# Patient Record
Sex: Female | Born: 1935 | Race: White | Hispanic: No | Marital: Married | Smoking: Former smoker
Health system: Southern US, Community
[De-identification: ages and names within clinical notes are randomized; demographics above are authoritative.]

## PROBLEM LIST (undated history)

## (undated) DIAGNOSIS — F0280 Dementia in other diseases classified elsewhere without behavioral disturbance: Secondary | ICD-10-CM

## (undated) DIAGNOSIS — H409 Unspecified glaucoma: Secondary | ICD-10-CM

## (undated) HISTORY — PX: ABDOMINAL HYSTERECTOMY: SHX81

---

## 2005-04-17 ENCOUNTER — Ambulatory Visit: Payer: Self-pay

## 2005-05-19 ENCOUNTER — Ambulatory Visit: Payer: Self-pay | Admitting: Obstetrics and Gynecology

## 2005-05-27 ENCOUNTER — Ambulatory Visit: Payer: Self-pay | Admitting: Gastroenterology

## 2006-01-11 ENCOUNTER — Emergency Department: Payer: Self-pay | Admitting: Emergency Medicine

## 2006-06-30 ENCOUNTER — Ambulatory Visit: Payer: Self-pay | Admitting: Internal Medicine

## 2007-07-02 ENCOUNTER — Ambulatory Visit: Payer: Self-pay | Admitting: Internal Medicine

## 2007-11-13 ENCOUNTER — Emergency Department: Payer: Self-pay | Admitting: Emergency Medicine

## 2009-04-25 ENCOUNTER — Ambulatory Visit: Payer: Self-pay | Admitting: Internal Medicine

## 2009-08-28 ENCOUNTER — Ambulatory Visit: Payer: Self-pay | Admitting: Internal Medicine

## 2010-06-12 ENCOUNTER — Ambulatory Visit: Payer: Self-pay | Admitting: Internal Medicine

## 2010-10-02 ENCOUNTER — Ambulatory Visit: Payer: Self-pay | Admitting: Internal Medicine

## 2011-09-30 ENCOUNTER — Ambulatory Visit: Payer: Self-pay | Admitting: Internal Medicine

## 2012-03-31 ENCOUNTER — Emergency Department: Payer: Self-pay | Admitting: Emergency Medicine

## 2012-03-31 LAB — COMPREHENSIVE METABOLIC PANEL
Albumin: 3.9 g/dL (ref 3.4–5.0)
Alkaline Phosphatase: 57 U/L (ref 50–136)
BUN: 21 mg/dL — ABNORMAL HIGH (ref 7–18)
Co2: 30 mmol/L (ref 21–32)
Creatinine: 1.04 mg/dL (ref 0.60–1.30)
EGFR (Non-African Amer.): 50 — ABNORMAL LOW
Osmolality: 284 (ref 275–301)
Potassium: 4 mmol/L (ref 3.5–5.1)
SGOT(AST): 25 U/L (ref 15–37)
SGPT (ALT): 21 U/L
Sodium: 141 mmol/L (ref 136–145)

## 2012-03-31 LAB — CBC
HGB: 14.1 g/dL (ref 12.0–16.0)
MCHC: 32.3 g/dL (ref 32.0–36.0)
RBC: 4.56 10*6/uL (ref 3.80–5.20)
WBC: 5 10*3/uL (ref 3.6–11.0)

## 2012-03-31 LAB — URINALYSIS, COMPLETE
Bacteria: NONE SEEN
Leukocyte Esterase: NEGATIVE
Nitrite: NEGATIVE
Ph: 5 (ref 4.5–8.0)
Protein: NEGATIVE
Specific Gravity: 1.01 (ref 1.003–1.030)
Squamous Epithelial: <1
WBC UR: 1 /HPF (ref 0–5)

## 2013-04-26 ENCOUNTER — Ambulatory Visit: Payer: Self-pay | Admitting: Urology

## 2013-04-26 LAB — BASIC METABOLIC PANEL
Anion Gap: 2 — ABNORMAL LOW (ref 7–16)
BUN: 23 mg/dL — ABNORMAL HIGH (ref 7–18)
Chloride: 106 mmol/L (ref 98–107)
Co2: 34 mmol/L — ABNORMAL HIGH (ref 21–32)
Creatinine: 1.07 mg/dL (ref 0.60–1.30)
EGFR (African American): 55 — ABNORMAL LOW
EGFR (Non-African Amer.): 48 — ABNORMAL LOW
Glucose: 87 mg/dL (ref 65–99)
Osmolality: 286 (ref 275–301)
Sodium: 142 mmol/L (ref 136–145)

## 2013-04-26 LAB — HEMOGLOBIN: HGB: 13.6 g/dL (ref 12.0–16.0)

## 2013-05-10 ENCOUNTER — Ambulatory Visit: Payer: Self-pay | Admitting: Urology

## 2013-05-11 LAB — PATHOLOGY REPORT

## 2014-07-08 ENCOUNTER — Emergency Department: Payer: Self-pay | Admitting: Emergency Medicine

## 2015-04-06 NOTE — Op Note (Signed)
PATIENT NAME:  Angie Wilson, BALUYOT MR#:  [id number] DATE OF BIRTH:  [birth year]  DATE OF PROCEDURE:  05/10/2013  PRINCIPAL DIAGNOSIS:  Right posterior wall bladder tumor.   POSTOPERATIVE DIAGNOSIS:  Same.  PROCEDURE:  Cystoscopy, bladder biopsy.   SURGEON:  Dr. Assunta Gambles.   ANESTHESIA:  Laryngeal mask airway anesthesia.   INDICATIONS: The patient is an 81 year old white female with a long history of chronic cystitis. She was recently noted to have hematuria without evidence of an infection. Cystoscopy demonstrated an approximate 1.5 to 2 cm area of scar and raised, erythematous mucosa with central ulceration on the right posterolateral wall. No other lesions were noted within the bladder. There was no evidence of infection at that time. Upper tracts demonstrated no significant abnormalities. She presents for cystoscopy, bladder biopsy.   PROCEDURE:  After informed consent was obtained, the patient was taken to the operating room and placed in the dorsal lithotomy position under laryngeal mask airway anesthesia. The patient was then prepped and draped in the usual standard fashion. The 22-French rigid cystoscope was introduced into the urethra under direct vision with no urethral abnormalities noted. Upon entering the bladder, the mucosa was inspected in its entirety. The 1.5 cm area of raised, erythematous mucosa was once again noted on the right posterolateral wall. There was a central area of raised scar with ulceration. Cold cup biopsies were obtained of this area. The area was then cauterized utilizing the Bugbee electrode. No significant bleeding was encountered. The bladder was drained. The cystoscope was removed. An 18-French red rubber catheter was inserted without difficulty; 20 mL of 2% lidocaine was instilled into the urinary bladder. The catheter was then removed. The patient was returned to the supine position and awakened from laryngeal mask airway anesthesia. She was taken to the recovery  room in stable condition. There were no problems or complications. The patient tolerated the procedure well.    ____________________________ Madolyn Frieze. Achilles Dunk, MD bsc:dmm D: 05/10/2013 10:48:36 ET T: 05/10/2013 11:14:41 ET JOB#: [id number]  cc: Madolyn Frieze. Achilles Dunk, MD, <Dictator> Madolyn Frieze Mckaylie Vasey MD ELECTRONICALLY SIGNED 05/12/2013 11:31

## 2015-08-01 ENCOUNTER — Emergency Department
Admission: EM | Admit: 2015-08-01 | Discharge: 2015-08-01 | Disposition: A | Discharge status: Home / Self Care | Payer: Medicare Other | Attending: Emergency Medicine | Admitting: Emergency Medicine

## 2015-08-01 ENCOUNTER — Encounter: Payer: Self-pay | Admitting: Urgent Care

## 2015-08-01 DIAGNOSIS — S5001XA Contusion of right elbow, initial encounter: Secondary | ICD-10-CM | POA: Insufficient documentation

## 2015-08-01 DIAGNOSIS — Y998 Other external cause status: Secondary | ICD-10-CM | POA: Insufficient documentation

## 2015-08-01 DIAGNOSIS — W109XXA Fall (on) (from) unspecified stairs and steps, initial encounter: Secondary | ICD-10-CM | POA: Diagnosis not present

## 2015-08-01 DIAGNOSIS — S51011A Laceration without foreign body of right elbow, initial encounter: Secondary | ICD-10-CM | POA: Insufficient documentation

## 2015-08-01 DIAGNOSIS — Z880 Allergy status to penicillin: Secondary | ICD-10-CM | POA: Insufficient documentation

## 2015-08-01 DIAGNOSIS — S8992XA Unspecified injury of left lower leg, initial encounter: Secondary | ICD-10-CM | POA: Diagnosis present

## 2015-08-01 DIAGNOSIS — Y9289 Other specified places as the place of occurrence of the external cause: Secondary | ICD-10-CM | POA: Diagnosis not present

## 2015-08-01 DIAGNOSIS — Y9389 Activity, other specified: Secondary | ICD-10-CM | POA: Insufficient documentation

## 2015-08-01 DIAGNOSIS — S81811A Laceration without foreign body, right lower leg, initial encounter: Secondary | ICD-10-CM | POA: Insufficient documentation

## 2015-08-01 DIAGNOSIS — S81812A Laceration without foreign body, left lower leg, initial encounter: Secondary | ICD-10-CM | POA: Diagnosis not present

## 2015-08-01 DIAGNOSIS — W19XXXA Unspecified fall, initial encounter: Secondary | ICD-10-CM

## 2015-08-01 HISTORY — DX: Unspecified glaucoma: H40.9

## 2015-08-01 MED ORDER — OXYCODONE-ACETAMINOPHEN 5-325 MG PO TABS
1.0000 | ORAL_TABLET | Freq: Once | ORAL | Status: AC
  Administered 2015-08-01: 1 via ORAL
  Filled 2015-08-01: qty 1

## 2015-08-01 MED ORDER — IBUPROFEN 200 MG PO TABS: 600.0000 mg | ORAL_TABLET | Freq: Four times a day (QID) | ORAL | Status: AC | PRN

## 2015-08-01 NOTE — ED Provider Notes (Signed)
[facility] Emergency Department Provider Note  ____________________________________________  Time seen: Approximately  550AM  I have reviewed the triage vital signs and the nursing notes.   HISTORY  Chief Complaint Fall   HPI NOEMY PELLERIN is a 81 y.o. female who reports that she fell going up the steps yesterday. The patient reports that she skinned her leg going up the steps and has a large skin tear to her right shin. The patient reports is very painful. She reports that she washed it off after she injured it but because of the size she decided to come in for evaluation. The patient did not take anything for pain and reports that she did not hit her head. She reports that her last tetanus was one year ago when she fell and did the same thing to her left leg.Patient reports that her pain is a 10 out of 10 in intensity.   Past Medical History  Diagnosis Date  . Glaucoma     There are no active problems to display for this patient.   Past Surgical History  Procedure Laterality Date  . Abdominal hysterectomy      Current Outpatient Rx  Name  Route  Sig  Dispense  Refill  . ibuprofen (MOTRIN IB) 200 MG tablet   Oral   Take 3 tablets (600 mg total) by mouth every 6 (six) hours as needed for moderate pain.   30 tablet   0     Allergies Penicillins  History reviewed. No pertinent family history.  Social History Social History  Substance Use Topics  . Smoking status: Never Smoker   . Smokeless tobacco: None  . Alcohol Use: Yes    Review of Systems Constitutional: No fever/chills Eyes: No visual changes. ENT: No sore throat. Cardiovascular: Denies chest pain. Respiratory: Denies shortness of breath. Gastrointestinal: No abdominal pain.  No nausea, no vomiting.  No diarrhea.  No constipation. Genitourinary: Negative for dysuria. Musculoskeletal: Negative for back pain. Skin: Skin tear Neurological: Negative for headaches, focal  weakness or numbness.  10-point ROS otherwise negative.  ____________________________________________   PHYSICAL EXAM:  VITAL SIGNS: ED Triage Vitals  Enc Vitals Group     BP 08/01/15 0037 164/77 mmHg     Pulse Rate 08/01/15 0037 65     Resp 08/01/15 0037 16     Temp 08/01/15 0037 97.5 F (36.4 C)     Temp Source 08/01/15 0037 Oral     SpO2 08/01/15 0037 94 %     Weight 08/01/15 0037 126 lb (57.153 kg)     Height 08/01/15 0037 5\' 4"  (1.626 m)     Head Cir --      Peak Flow --      Pain Score 08/01/15 0038 6     Pain Loc --      Pain Edu? --      Excl. in GC? --     Constitutional: Alert and oriented. Well appearing and in moderate distress. Eyes: Conjunctivae are normal. PERRL. EOMI. Head: Atraumatic. Nose: No congestion/rhinnorhea. Mouth/Throat: Mucous membranes are moist.  Oropharynx non-erythematous. Cardiovascular: Normal rate, regular rhythm. Grossly normal heart sounds.  Good peripheral circulation. Respiratory: Normal respiratory effort.  No retractions. Lungs CTAB. Gastrointestinal: Soft and nontender. No distention. Positive bowel sounds Musculoskeletal: No lower extremity tenderness nor edema.   Neurologic:  Normal speech and language.  Skin:  Large skin tear to right shin, small skin tear to right elbow Psychiatric: Mood and affect are normal.  ____________________________________________   LABS (all labs ordered are listed, but only abnormal results are displayed)  Labs Reviewed - No data to display ____________________________________________  EKG  None ____________________________________________  RADIOLOGY  None ____________________________________________   PROCEDURES  Procedure(s) performed: None  Critical Care performed: No  ____________________________________________   INITIAL IMPRESSION / ASSESSMENT AND PLAN / ED COURSE  Pertinent labs & imaging results that were available during my care of the patient were reviewed by me  and considered in my medical decision making (see chart for details).  This is an 82 year old female who reports that she fell earlier today and sustained a skin tear to her right shin. The patient reports that given the size she wanted to come and be repaired. The patient's skin tear is significant covering the majority of her right shin but she has folded the flap back over. There is some mild oozing at the site. The area was cleaned and prepped and glued and Steri-Stripped together. There was no significant bleeding after the wound was repaired. The patient will be discharged to follow back up with her primary care physician. She has no other pain she is able to walk she has no swelling or deformity. ____________________________________________   FINAL CLINICAL IMPRESSION(S) / ED DIAGNOSES  Final diagnoses:  Skin tear of left lower leg without complication, initial encounter  Fall, initial encounter  Elbow contusion, right, initial encounter      Rebecka Apley, MD 08/01/15 [phone]

## 2015-08-01 NOTE — Discharge Instructions (Signed)
Contusion A contusion is a deep bruise. Contusions are the result of an injury that caused bleeding under the skin. The contusion may turn blue, purple, or yellow. Minor injuries will give you a painless contusion, but more severe contusions may stay painful and swollen for a few weeks.  CAUSES  A contusion is usually caused by a blow, trauma, or direct force to an area of the body. SYMPTOMS   Swelling and redness of the injured area.  Bruising of the injured area.  Tenderness and soreness of the injured area.  Pain. DIAGNOSIS  The diagnosis can be made by taking a history and physical exam. An X-ray, CT scan, or MRI may be needed to determine if there were any associated injuries, such as fractures. TREATMENT  Specific treatment will depend on what area of the body was injured. In general, the best treatment for a contusion is resting, icing, elevating, and applying cold compresses to the injured area. Over-the-counter medicines may also be recommended for pain control. Ask your caregiver what the best treatment is for your contusion. HOME CARE INSTRUCTIONS   Put ice on the injured area.  Put ice in a plastic bag.  Place a towel between your skin and the bag.  Leave the ice on for 15-20 minutes, 3-4 times a day, or as directed by your health care provider.  Only take over-the-counter or prescription medicines for pain, discomfort, or fever as directed by your caregiver. Your caregiver may recommend avoiding anti-inflammatory medicines (aspirin, ibuprofen, and naproxen) for 48 hours because these medicines may increase bruising.  Rest the injured area.  If possible, elevate the injured area to reduce swelling. SEEK IMMEDIATE MEDICAL CARE IF:   You have increased bruising or swelling.  You have pain that is getting worse.  Your swelling or pain is not relieved with medicines. MAKE SURE YOU:   Understand these instructions.  Will watch your condition.  Will get help right  away if you are not doing well or get worse. Document Released: 09/10/2005 Document Revised: 12/06/2013 Document Reviewed: 10/06/2011 [facility] Patient Information 2015 [place], [place]. This information is not intended to replace advice given to you by your health care provider. Make sure you discuss any questions you have with your health care provider.  Skin Tear Care A skin tear is a wound in which the top layer of skin has peeled off. This is a common problem with aging because the skin becomes thinner and more fragile as a person gets older. In addition, some medicines, such as oral corticosteroids, can lead to skin thinning if taken for long periods of time.  A skin tear is often repaired with tape or skin adhesive strips. This keeps the skin that has been peeled off in contact with the healthier skin beneath. Depending on the location of the wound, a bandage (dressing) may be applied over the tape or skin adhesive strips. Sometimes, during the healing process, the skin turns black and dies. Even when this happens, the torn skin acts as a good dressing until the skin underneath gets healthier and repairs itself. HOME CARE INSTRUCTIONS   Change dressings once per day or as directed by your caregiver.  Gently clean the skin tear and the area around the tear using saline solution or mild soap and water.  Do not rub the injured skin dry. Let the area air dry.  Apply petroleum jelly or an antibiotic cream or ointment to keep the tear moist. This will help the wound heal. Do  not allow a scab to form.  If the dressing sticks before the next dressing change, moisten it with warm soapy water and gently remove it.  Protect the injured skin until it has healed.  Only take over-the-counter or prescription medicines as directed by your caregiver.  Take showers or baths using warm soapy water. Apply a new dressing after the shower or bath.  Keep all follow-up appointments as directed by your  caregiver.  SEEK IMMEDIATE MEDICAL CARE IF:   You have redness, swelling, or increasing pain in the skin tear.  You havepus coming from the skin tear.  You have chills.  You have a red streak that goes away from the skin tear.  You have a bad smell coming from the tear or dressing.  You have a fever or persistent symptoms for more than 2-3 days.  You have a fever and your symptoms suddenly get worse. MAKE SURE YOU:  Understand these instructions.  Will watch this condition.  Will get help right away if your child is not doing well or gets worse. Document Released: 08/26/2001 Document Revised: 08/25/2012 Document Reviewed: 06/14/2012 [facility] Patient Information 2015 [place], [place]. This information is not intended to replace advice given to you by your health care provider. Make sure you discuss any questions you have with your health care provider.   Fall Prevention and Home Safety Falls cause injuries and can affect all age groups. It is possible to prevent falls.  HOW TO PREVENT FALLS  Wear shoes with rubber soles that do not have an opening for your toes.  Keep the inside and outside of your house well lit.  Use night lights throughout your home.  Remove clutter from floors.  Clean up floor spills.  Remove throw rugs or fasten them to the floor with carpet tape.  Do not place electrical cords across pathways.  Put grab bars by your tub, shower, and toilet. Do not use towel bars as grab bars.  Put handrails on both sides of the stairway. Fix loose handrails.  Do not climb on stools or stepladders, if possible.  Do not wax your floors.  Repair uneven or unsafe sidewalks, walkways, or stairs.  Keep items you use a lot within reach.  Be aware of pets.  Keep emergency numbers next to the telephone.  Put smoke detectors in your home and near bedrooms. Ask your doctor what other things you can do to prevent falls. Document Released: 09/27/2009 Document  Revised: 06/01/2012 Document Reviewed: 03/02/2012 [facility] Patient Information 2015 [place], [place]. This information is not intended to replace advice given to you by your health care provider. Make sure you discuss any questions you have with your health care provider.  Wound Care Wound care helps prevent pain and infection.  You may need a tetanus shot if:  You cannot remember when you had your last tetanus shot.  You have never had a tetanus shot.  The injury broke your skin. If you need a tetanus shot and you choose not to have one, you may get tetanus. Sickness from tetanus can be serious. HOME CARE   Only take medicine as told by your doctor.  Clean the wound daily with mild soap and water.  Change any bandages (dressings) as told by your doctor.  Put medicated cream and a bandage on the wound as told by your doctor.  Change the bandage if it gets wet, dirty, or starts to smell.  Take showers. Do not take baths, swim, or do anything  that puts your wound under water.  Rest and raise (elevate) the wound until the pain and puffiness (swelling) are better.  Keep all doctor visits as told. GET HELP RIGHT AWAY IF:   Yellowish-white fluid (pus) comes from the wound.  Medicine does not lessen your pain.  There is a red streak going away from the wound.  You have a fever. MAKE SURE YOU:   Understand these instructions.  Will watch your condition.  Will get help right away if you are not doing well or get worse. Document Released: 09/09/2008 Document Revised: 02/23/2012 Document Reviewed: 04/06/2011 [facility] Patient Information 2015 [place], [place]. This information is not intended to replace advice given to you by your health care provider. Make sure you discuss any questions you have with your health care provider.

## 2015-08-01 NOTE — ED Notes (Signed)
Pt reports falling up stairs, mechanical fall.  Pt reports skin tear to right lower leg, and right elbow.  8" skin tear noted to right lower leg, still bleeding slowly after removing dressing.  Small 1" skin tear noted to right elbow, no bleeding at this time.  Pt NAD

## 2015-08-01 NOTE — ED Notes (Addendum)
Patient presents s/p fall. Patient advising that she fell up some stairs. Large skin tears noted to RIGHT shin. Patient also with small skin tear to RIGHT elbow. Dressings applied in triage.

## 2015-10-09 ENCOUNTER — Other Ambulatory Visit: Payer: Self-pay | Admitting: Internal Medicine

## 2015-10-09 DIAGNOSIS — M48061 Spinal stenosis, lumbar region without neurogenic claudication: Secondary | ICD-10-CM

## 2015-10-10 ENCOUNTER — Other Ambulatory Visit: Payer: Self-pay | Admitting: Internal Medicine

## 2015-10-10 DIAGNOSIS — R9389 Abnormal findings on diagnostic imaging of other specified body structures: Secondary | ICD-10-CM

## 2015-10-12 ENCOUNTER — Ambulatory Visit
Admission: RE | Admit: 2015-10-12 | Discharge: 2015-10-12 | Disposition: A | Discharge status: Home / Self Care | Payer: Medicare Other | Source: Ambulatory Visit | Attending: Internal Medicine | Admitting: Internal Medicine

## 2015-10-12 DIAGNOSIS — I2510 Atherosclerotic heart disease of native coronary artery without angina pectoris: Secondary | ICD-10-CM | POA: Diagnosis not present

## 2015-10-12 DIAGNOSIS — J449 Chronic obstructive pulmonary disease, unspecified: Secondary | ICD-10-CM | POA: Diagnosis not present

## 2015-10-12 DIAGNOSIS — R9389 Abnormal findings on diagnostic imaging of other specified body structures: Secondary | ICD-10-CM

## 2015-10-12 DIAGNOSIS — R918 Other nonspecific abnormal finding of lung field: Secondary | ICD-10-CM | POA: Diagnosis not present

## 2015-10-12 DIAGNOSIS — E042 Nontoxic multinodular goiter: Secondary | ICD-10-CM | POA: Diagnosis not present

## 2015-10-12 DIAGNOSIS — R938 Abnormal findings on diagnostic imaging of other specified body structures: Secondary | ICD-10-CM | POA: Diagnosis present

## 2015-10-12 MED ORDER — IOHEXOL 300 MG/ML  SOLN
75.0000 mL | Freq: Once | INTRAMUSCULAR | Status: AC | PRN
  Administered 2015-10-12: 75 mL via INTRAVENOUS

## 2015-10-18 ENCOUNTER — Other Ambulatory Visit: Payer: Self-pay | Admitting: Neurology

## 2015-10-18 DIAGNOSIS — R413 Other amnesia: Secondary | ICD-10-CM

## 2015-10-18 DIAGNOSIS — R251 Tremor, unspecified: Secondary | ICD-10-CM

## 2015-10-19 ENCOUNTER — Ambulatory Visit
Admission: RE | Admit: 2015-10-19 | Discharge: 2015-10-19 | Disposition: A | Discharge status: Home / Self Care | Payer: Medicare Other | Source: Ambulatory Visit | Attending: Neurology | Admitting: Neurology

## 2015-10-19 ENCOUNTER — Ambulatory Visit
Admission: RE | Admit: 2015-10-19 | Discharge: 2015-10-19 | Disposition: A | Discharge status: Home / Self Care | Payer: Medicare Other | Source: Ambulatory Visit | Attending: Internal Medicine | Admitting: Internal Medicine

## 2015-10-19 DIAGNOSIS — I679 Cerebrovascular disease, unspecified: Secondary | ICD-10-CM | POA: Diagnosis not present

## 2015-10-19 DIAGNOSIS — M47896 Other spondylosis, lumbar region: Secondary | ICD-10-CM | POA: Diagnosis not present

## 2015-10-19 DIAGNOSIS — M5127 Other intervertebral disc displacement, lumbosacral region: Secondary | ICD-10-CM | POA: Insufficient documentation

## 2015-10-19 DIAGNOSIS — M2578 Osteophyte, vertebrae: Secondary | ICD-10-CM | POA: Diagnosis not present

## 2015-10-19 DIAGNOSIS — G319 Degenerative disease of nervous system, unspecified: Secondary | ICD-10-CM | POA: Insufficient documentation

## 2015-10-19 DIAGNOSIS — M4806 Spinal stenosis, lumbar region: Secondary | ICD-10-CM | POA: Insufficient documentation

## 2015-10-19 DIAGNOSIS — M545 Low back pain: Secondary | ICD-10-CM | POA: Diagnosis present

## 2015-10-19 DIAGNOSIS — M48061 Spinal stenosis, lumbar region without neurogenic claudication: Secondary | ICD-10-CM

## 2015-10-19 DIAGNOSIS — R413 Other amnesia: Secondary | ICD-10-CM

## 2015-10-19 DIAGNOSIS — M6281 Muscle weakness (generalized): Secondary | ICD-10-CM | POA: Diagnosis present

## 2015-10-19 DIAGNOSIS — R4189 Other symptoms and signs involving cognitive functions and awareness: Secondary | ICD-10-CM | POA: Diagnosis present

## 2015-10-19 DIAGNOSIS — R251 Tremor, unspecified: Secondary | ICD-10-CM

## 2015-10-22 ENCOUNTER — Other Ambulatory Visit: Payer: Self-pay | Admitting: Neurology

## 2015-10-22 DIAGNOSIS — I671 Cerebral aneurysm, nonruptured: Secondary | ICD-10-CM

## 2015-11-05 ENCOUNTER — Ambulatory Visit
Admission: RE | Admit: 2015-11-05 | Discharge: 2015-11-05 | Disposition: A | Discharge status: Home / Self Care | Payer: Medicare Other | Source: Ambulatory Visit | Attending: Neurology | Admitting: Neurology

## 2015-11-05 DIAGNOSIS — I729 Aneurysm of unspecified site: Secondary | ICD-10-CM | POA: Diagnosis present

## 2015-11-05 DIAGNOSIS — I672 Cerebral atherosclerosis: Secondary | ICD-10-CM | POA: Insufficient documentation

## 2015-11-05 DIAGNOSIS — I671 Cerebral aneurysm, nonruptured: Secondary | ICD-10-CM | POA: Diagnosis not present

## 2015-11-20 ENCOUNTER — Encounter: Payer: Self-pay | Admitting: Physical Therapy

## 2015-11-20 ENCOUNTER — Ambulatory Visit: Payer: Medicare Other | Attending: Neurology | Admitting: Physical Therapy

## 2015-11-20 DIAGNOSIS — R531 Weakness: Secondary | ICD-10-CM | POA: Insufficient documentation

## 2015-11-20 DIAGNOSIS — R262 Difficulty in walking, not elsewhere classified: Secondary | ICD-10-CM | POA: Diagnosis present

## 2015-11-20 DIAGNOSIS — R2681 Unsteadiness on feet: Secondary | ICD-10-CM | POA: Insufficient documentation

## 2015-11-20 NOTE — Therapy (Signed)
[facility] MAIN [facility] SERVICES [address] [place], [place], [address] Phone: [phone]   Fax:  [phone]  Physical Therapy Evaluation  Patient Details  Name: Angie Wilson MRN: [id number] Date of Birth: 1925/12/26 Referring Provider: shah  Encounter Date: 11/20/2015      PT End of Session - 11/20/15 1123    Visit Number 1   Number of Visits 25   Date for PT Re-Evaluation 03/05/2016   Authorization Type g codes   PT Start Time 1100   PT Stop Time 1200   PT Time Calculation (min) 60 min   Equipment Utilized During Treatment Gait belt   Activity Tolerance Patient limited by fatigue   Behavior During Therapy [facility] for tasks assessed/performed      Past Medical History  Diagnosis Date  . Glaucoma     Past Surgical History  Procedure Laterality Date  . Abdominal hysterectomy      There were no vitals filed for this visit.  Visit Diagnosis:  Unsteady gait  Difficulty walking  Weakness      Subjective Assessment - 11/20/15 1109    Subjective Patient had a fall 3 months ago and she has been stumbling in her home and has been using her spc for 6  weeks.    Currently in Pain? Yes   Pain Score 4    Pain Location Back            [facility] PT Assessment - 11/20/15 0001    Assessment   Medical Diagnosis unsteady gait   Referring Provider shah   Onset Date/Surgical Date 09/15/15   Hand Dominance Right   Next MD Visit Feb, 1 2017   Prior Therapy none   Precautions   Precautions Fall   Balance Screen   Has the patient fallen in the past 6 months Yes   How many times? 3   Has the patient had a decrease in activity level because of a fear of falling?  Yes   Is the patient reluctant to leave their home because of a fear of falling?  No   Home [employer] information [employer] manager residence   Living Arrangements Spouse/significant other   Available Help at Discharge Family   Type of Home House   Home Access Stairs to  enter   Entrance Stairs-Number of Steps 3   Entrance Stairs-Rails Right   Home Layout Two level   Alternate Level Stairs-Number of Steps 12   Alternate Level Stairs-Rails Right   Home Equipment Grab bars - tub/shower   Prior Function   Level of Independence Independent   Vocation Retired       PAIN: back pain 5/10  POSTURE: WNL  PROM/AROM:  STRENGTH:  Graded on a 0-5 scale Muscle Group Left Right  Shoulder flex    Shoulder Abd    Shoulder Ext    Shoulder IR/ER    Elbow    Wrist/hand    Hip Flex 3 3  Hip Abd 3 3  Hip Add 2 2  Hip Ext 2 2  Hip IR/ER 3 3  Knee Flex 4 4  Knee Ext 4 4  Ankle DF 4 4  Ankle PF 4 4   SENSATION: WFL         BALANCE: unable to tandem stand, unable to single leg stand    GAIT: deviations in path and unsteady with spc  OUTCOME MEASURES: TEST Outcome Interpretation  5 times sit<>stand 15.26sec >52 yo, >15  sec indicates increased risk for falls  10 meter walk test    1.05             m/s <1.0 m/s indicates increased risk for falls; limited community ambulator  Timed up and Go   14.87              sec <14 sec indicates increased risk for falls  6 minute walk test       650      Feet 1000 feet is community Financial controller  <36/56 (100% risk for falls), 37-45 (80% risk for falls); 46-51 (>50% risk for falls); 52-55 (lower risk <25% of falls)                                  PT Long Term Goals - 11-21-15 1127    PT LONG TERM GOAL #1   Title Patient will increase BLE gross strength to 4+/5 as to improve functional strength for independent gait, increased standing tolerance and increased ADL ability   Time 12   Period Weeks   Status New   PT LONG TERM GOAL #2   Title Patient will increase BLE gross strength to 4+/5 as to improve functional strength for independent gait, increased standing tolerance and increased ADL ability   Time 12   Period Weeks   Status New   PT LONG TERM GOAL #3    Title Patient will tolerate 5 seconds of single leg stance without loss of balance to improve ability to get in and out of shower safely   Time 12   Period Weeks   Status New   PT LONG TERM GOAL #4   Title Patient will reduce timed up and go to <11 seconds to reduce fall risk and demonstrate improved transfer/gait ability   Time 12   Period Weeks   Status New   PT LONG TERM GOAL #5   Title Patient will increase six minute walk test distance to >1000 for progression to community ambulator and improve gait ability   Time 12   Status New               Plan - 2015/11/21 1125    Clinical Impression Statement Pateint is 81 yr old female with unsteady gait and decreased dynamic stanidng balnace. She is ambulating with a spc and has had 3 falls in the last 6 months. She has uneven path during gait with staggering  steps.    Pt will benefit from skilled therapeutic intervention in order to improve on the following deficits Abnormal gait;Decreased balance;Decreased endurance;Decreased mobility;Decreased strength;Pain   Rehab Potential Good   PT Frequency 2x / week   PT Duration 12 weeks   PT Treatment/Interventions Neuromuscular re-education;Balance training;Therapeutic exercise;Therapeutic activities;Gait training;Stair training   PT Next Visit Plan Balance training   PT Home Exercise Plan HEP corner balance exercises   Consulted and Agree with Plan of Care Patient          G-Codes - 11-21-2015 1131    Functional Assessment Tool Used 5 x sit to stand, TUG, 10 MW, 6 MW   Functional Limitation Mobility: Walking and moving around   Mobility: Walking and Moving Around Current Status (U4403) At least 40 percent but less than 60 percent impaired, limited or restricted   Mobility: Walking and Moving Around Goal Status (K7425) At least 20 percent but less than 40 percent impaired, limited  or restricted       Problem List There are no active problems to display for this  patient.   Angie Wilson 11/20/2015, 12:04 PM  [facility] MAIN [facility] SERVICES [address] [place], [place], [address] Phone: [phone]   Fax:  [phone]  Name: Angie Wilson MRN: [id number] Date of Birth: 04-Mar-1926

## 2015-11-27 ENCOUNTER — Ambulatory Visit: Payer: Medicare Other | Admitting: Physical Therapy

## 2015-11-27 ENCOUNTER — Encounter: Payer: Self-pay | Admitting: Physical Therapy

## 2015-11-27 DIAGNOSIS — R262 Difficulty in walking, not elsewhere classified: Secondary | ICD-10-CM

## 2015-11-27 DIAGNOSIS — R2681 Unsteadiness on feet: Secondary | ICD-10-CM | POA: Diagnosis not present

## 2015-11-27 DIAGNOSIS — R531 Weakness: Secondary | ICD-10-CM

## 2015-11-27 NOTE — Patient Instructions (Signed)
Balance, Proprioception: Hip Abduction With Tubing   With tubing attached to both ankles, Standing holding onto counter, kick one leg out to side and then Return.  Repeat _10___ times  On each side.  Do ___2_ sessions per day.  http://cc.exer.us/20   Copyright  VHI. All rights reserved.  Balance, Proprioception: Hip Adduction With Tubing   With tubing tied around table leg, loop band around one ankle and try to cross leg in front. Return. Repeat _10___ times. do __2__ sessions per day.  http://cc.exer.[email or web address]   Copyright  VHI. All rights reserved.  Balance, Proprioception: Hip Extension With Tubing   With tubing tied around both legs, holding onto kitchen counter, swing leg back. Return. Repeat _10___ times . Do __2__ sessions per day.  http://cc.exer.us/19   Copyright  VHI. All rights reserved.  Balance, Proprioception: Hip Flexion With Tubing   With tubing attached to both ankles, swing leg forward. Return. Repeat _10___ times. Do __2__ sessions per day. SIT TO STAND: No Device    Sit with feet shoulder-width apart, on floor. Lean chest forward, raise hips up from surface. Straighten hips and knees. Weight bear equally on left and right sides. ___ reps per set, ___ sets per day, ___ days per week Place left leg closer to sitting surface.   Single Leg - Eyes Open  Holding support, lift right leg while maintaining balance over other leg. Progress to removing hands from support surface for longer periods of time. Hold____ seconds. Repeat ____ times per session. Do ____ sessions per day. Single Leg (Compliant Surface) - Eyes Open  Stand on compliant surface: ________ holding support. Lift right leg while maintaining balance over other leg. Progress to removing hands from support surface for longer periods of time. Hold____ seconds. Repeat ____ times per session. Do ____ sessions per day.  Feet Heel-Toe "Tandem", Arm Motion  Eyes Open  With eyes open, right foot  directly in front of the other, move arms up and down: to front. Repeat ____ times per session. Do ____ sessions per day. Feet Heel-Toe "Tandem" (Compliant Surface) Arm Motion - Eyes Open  With eyes open, standing on compliant surface: ________, right foot directly in front of the other, move arms up and down: to front. Repeat ____ times per session. Do ____ per day.  Feet Heel-Toe "Tandem"   Arms outstretched, walk a straight line bringing one foot directly in front of the other. Repeat for ____ minutes per session. _ per day. Side-Stepping   Walk to left side with eyes open. Take even steps, leading with same foot. Repeat in other direction. Repeat for _ minutes per session.Do __ per day.   __.  Copyright  VHI. All rights reserved.

## 2015-11-27 NOTE — Therapy (Signed)
[place] [facility] MAIN [facility] SERVICES [address] [place], [place], [address] Phone: [phone]   Fax:  [phone]  Physical Therapy Treatment  Patient Details  Name: ANI PATENAUDE MRN: [id number] Date of Birth: 1926/10/31 Referring Provider: shah  Encounter Date: 11/27/2015      PT End of Session - 11/27/15 0855    Visit Number 2   Number of Visits 25   Date for PT Re-Evaluation 02-20-16   Authorization Type g codes   PT Start Time 0835   PT Stop Time 0915   PT Time Calculation (min) 40 min   Equipment Utilized During Treatment Gait belt   Activity Tolerance Patient tolerated treatment well;No increased pain;Patient limited by fatigue   Behavior During Therapy [facility] for tasks assessed/performed      Past Medical History  Diagnosis Date  . Glaucoma     Past Surgical History  Procedure Laterality Date  . Abdominal hysterectomy      There were no vitals filed for this visit.  Visit Diagnosis:  Unsteady gait  Difficulty walking  Weakness      Subjective Assessment - 11/27/15 0853    Subjective Patient continues to have standing balance deficits and unsteady gait. She has difficulty with sequencing the cane during ambulation.   Currently in Pain? No/denies      Standing on blue foam with ball sorting, cone reaching activities.   standing hip abd with YTB x 20  side stepping left and right in parallel bars 10 feet x 3 standing on blue foam with cone reaching x 20 across midline step ups from floor to 6 inch stool x 20 bilateral sit to stand x 10 marching in parallel bars x 20 stepping pattern with weight shifting fwd/bwd x 10.  Patient needs occasional verbal cueing to improve posture and cueing to correctly perform exercises slowly, holding at end of range to increase motor firing of desired muscle to encourage fatigue.                           PT Education - 11/27/15 0855    Education provided  Yes   Education Details HEP             PT Long Term Goals - 11/20/15 1127    PT LONG TERM GOAL #1   Title Patient will increase BLE gross strength to 4+/5 as to improve functional strength for independent gait, increased standing tolerance and increased ADL ability   Time 12   Period Weeks   Status New   PT LONG TERM GOAL #2   Title Patient will increase BLE gross strength to 4+/5 as to improve functional strength for independent gait, increased standing tolerance and increased ADL ability   Time 12   Period Weeks   Status New   PT LONG TERM GOAL #3   Title Patient will tolerate 5 seconds of single leg stance without loss of balance to improve ability to get in and out of shower safely   Time 12   Period Weeks   Status New   PT LONG TERM GOAL #4   Title Patient will reduce timed up and go to <11 seconds to reduce fall risk and demonstrate improved transfer/gait ability   Time 12   Period Weeks   Status New   PT LONG TERM GOAL #5   Title Patient will increase six minute walk test distance to >1000 for progression to community  ambulator and improve gait ability   Time 12   Status New               Plan - 11/27/15 0856    Clinical Impression Statement Patient is able to perform static and dynamic standing balance activites with some loss of balance and cuing for safety and posture.    Pt will benefit from skilled therapeutic intervention in order to improve on the following deficits Abnormal gait;Decreased balance;Decreased endurance;Decreased mobility;Decreased strength;Pain   Rehab Potential Good   PT Frequency 2x / week   PT Duration 12 weeks   PT Treatment/Interventions Neuromuscular re-education;Balance training;Therapeutic exercise;Therapeutic activities;Gait training;Stair training   PT Next Visit Plan Balance training   PT Home Exercise Plan HEP corner balance exercises   Consulted and Agree with Plan of Care Patient        Problem List There are no  active problems to display for this patient.   [name] 11/27/2015, 8:57 AM  [place] [facility] MAIN [facility] SERVICES [address] [place], [place], [address] Phone: [phone]   Fax:  [phone]  Name: SHEALEIGH DUNSTAN MRN: [id number] Date of Birth: [birth year]

## 2015-11-29 ENCOUNTER — Encounter: Payer: Self-pay | Admitting: Physical Therapy

## 2015-11-29 ENCOUNTER — Ambulatory Visit: Payer: Medicare Other | Admitting: Physical Therapy

## 2015-11-29 DIAGNOSIS — R531 Weakness: Secondary | ICD-10-CM

## 2015-11-29 DIAGNOSIS — R2681 Unsteadiness on feet: Secondary | ICD-10-CM | POA: Diagnosis not present

## 2015-11-29 DIAGNOSIS — R262 Difficulty in walking, not elsewhere classified: Secondary | ICD-10-CM

## 2015-11-29 NOTE — Therapy (Signed)
[place] [facility] MAIN [facility] SERVICES [address] [place], [place], [address] Phone: [phone]   Fax:  [phone]  Physical Therapy Treatment  Patient Details  Name: Angie Wilson MRN: [id number] Date of Birth: 09/06/1926 Referring Provider: shah  Encounter Date: 11/29/2015      PT End of Session - 11/29/15 0847    Visit Number 3   Number of Visits 25   Date for PT Re-Evaluation 2016/02/13   Authorization Type g codes   PT Start Time 0835   PT Stop Time 0915   PT Time Calculation (min) 40 min   Equipment Utilized During Treatment Gait belt   Activity Tolerance Patient tolerated treatment well;No increased pain;Patient limited by fatigue   Behavior During Therapy [facility] for tasks assessed/performed      Past Medical History  Diagnosis Date  . Glaucoma     Past Surgical History  Procedure Laterality Date  . Abdominal hysterectomy      There were no vitals filed for this visit.  Visit Diagnosis:  Unsteady gait  Difficulty walking  Weakness      Subjective Assessment - 11/29/15 0847    Subjective Patient continues to have standing balance deficits and unsteady gait. She has difficulty with sequencing the cane during ambulation.   Currently in Pain? No/denies     leg press x 90 lbs x 20 x 3, heel raises x 20 x 3 with 90 lbs Matrix fwd/bwd/side stepping  standing hip abd with YTB x 20  side stepping left and right in parallel bars with YTB x  10 feet x 15 standing on blue foam with cone reaching x 20 across midline step ups from floor to 6 inch stool x 20 bilateral sit to stand x 10 marching in parallel bars x 20 .  CGA and Min to mod verbal cues used throughout with increased in postural sway and LOB most seen with narrow base of support and while on uneven surfaces. Continues to have balance deficits typical with diagnosis. Patient performs intermediate level exercises without pain behaviors and needs verbal cuing for  postural alignment and head positioning Tactile cues and assistance needed to keep lower leg and knee in neutral to avoid compensations with ankle motions.                           PT Education - 11/29/15 0847    Education provided Yes   Education Details HEP   Person(s) Educated Patient   Methods Explanation   Comprehension Verbalized understanding             PT Long Term Goals - 11/20/15 1127    PT LONG TERM GOAL #1   Title Patient will increase BLE gross strength to 4+/5 as to improve functional strength for independent gait, increased standing tolerance and increased ADL ability   Time 12   Period Weeks   Status New   PT LONG TERM GOAL #2   Title Patient will increase BLE gross strength to 4+/5 as to improve functional strength for independent gait, increased standing tolerance and increased ADL ability   Time 12   Period Weeks   Status New   PT LONG TERM GOAL #3   Title Patient will tolerate 5 seconds of single leg stance without loss of balance to improve ability to get in and out of shower safely   Time 12   Period Weeks   Status New  PT LONG TERM GOAL #4   Title Patient will reduce timed up and go to <11 seconds to reduce fall risk and demonstrate improved transfer/gait ability   Time 12   Period Weeks   Status New   PT LONG TERM GOAL #5   Title Patient will increase six minute walk test distance to >1000 for progression to community ambulator and improve gait ability   Time 12   Status New               Plan - 11/29/15 0848    Clinical Impression Statement Min cueing needed to appropriately perform balance  tasks with leg, hand, and head position. Decreased coordination demonstrated requiring consistent verbal cueing to correct form   Pt will benefit from skilled therapeutic intervention in order to improve on the following deficits Abnormal gait;Decreased balance;Decreased endurance;Decreased mobility;Decreased strength;Pain    Rehab Potential Good   PT Frequency 2x / week   PT Duration 12 weeks   PT Treatment/Interventions Neuromuscular re-education;Balance training;Therapeutic exercise;Therapeutic activities;Gait training;Stair training   PT Next Visit Plan Balance training   PT Home Exercise Plan HEP corner balance exercises   Consulted and Agree with Plan of Care Patient        Problem List There are no active problems to display for this patient.   [name] 11/29/2015, 8:49 AM  [facility] MAIN [facility] SERVICES [address] [place], [place], [address] Phone: [phone]   Fax:  [phone]  Name: Angie Wilson MRN: [id number] Date of Birth: 10-16-1926

## 2015-12-04 ENCOUNTER — Encounter: Payer: Self-pay | Admitting: Physical Therapy

## 2015-12-04 ENCOUNTER — Ambulatory Visit: Payer: Medicare Other | Admitting: Physical Therapy

## 2015-12-04 DIAGNOSIS — R2681 Unsteadiness on feet: Secondary | ICD-10-CM

## 2015-12-04 DIAGNOSIS — R531 Weakness: Secondary | ICD-10-CM

## 2015-12-04 DIAGNOSIS — R262 Difficulty in walking, not elsewhere classified: Secondary | ICD-10-CM

## 2015-12-04 NOTE — Therapy (Signed)
[place] [facility] MAIN [facility] SERVICES [address] [place], [place], [address] Phone: [phone]   Fax:  [phone]  Physical Therapy Treatment  Patient Details  Name: Angie Wilson MRN: [id number] Date of Birth: 1926/07/28 Referring Provider: shah  Encounter Date: 12/04/2015      PT End of Session - 12/04/15 0833    Visit Number 4   Number of Visits 25   Date for PT Re-Evaluation 07-Mar-2016   Authorization Type g codes   Equipment Utilized During Treatment Gait belt   Activity Tolerance Patient tolerated treatment well;No increased pain;Patient limited by fatigue   Behavior During Therapy [facility] for tasks assessed/performed      Past Medical History  Diagnosis Date  . Glaucoma     Past Surgical History  Procedure Laterality Date  . Abdominal hysterectomy      There were no vitals filed for this visit.  Visit Diagnosis:  Unsteady gait  Difficulty walking  Weakness      Subjective Assessment - 12/04/15 0832    Subjective Patient continues to have standing balance deficits and unsteady gait. She has difficulty with sequencing the cane during ambulation.   Currently in Pain? No/denies      Therapeutic exercise;   step ups from floor to 6 inch stool x 20 bilateral Sit to stand x 10 x 2 from chair Eccentric step downs x 10 x 2 BLE Heel raises standing    Neuromuscular training: TM walking 1. 5 m/hr standing on blue foam with cone reaching x 20 across midline  Blue foam tapping to stool, 1/2 foam standing and catching a ball ,taping a balloon.  Blue balance beam side stepping left and right x 10 in parallel bars Matrix fwd/bwd stepping, side stepping   Patient continues to demonstrates decreased dynamic balance with movement with select exercises Patient responds well to verbal and tactile cues to correct form and technique. Muscle fatigue but no major pain complaints.                             PT Education - 12/04/15 [phone]    Education provided Yes   Education Details HEP   Person(s) Educated Patient   Methods Explanation   Comprehension Verbalized understanding             PT Long Term Goals - 11/20/15 1127    PT LONG TERM GOAL #1   Title Patient will increase BLE gross strength to 4+/5 as to improve functional strength for independent gait, increased standing tolerance and increased ADL ability   Time 12   Period Weeks   Status New   PT LONG TERM GOAL #2   Title Patient will increase BLE gross strength to 4+/5 as to improve functional strength for independent gait, increased standing tolerance and increased ADL ability   Time 12   Period Weeks   Status New   PT LONG TERM GOAL #3   Title Patient will tolerate 5 seconds of single leg stance without loss of balance to improve ability to get in and out of shower safely   Time 12   Period Weeks   Status New   PT LONG TERM GOAL #4   Title Patient will reduce timed up and go to <11 seconds to reduce fall risk and demonstrate improved transfer/gait ability   Time 12   Period Weeks   Status New   PT LONG TERM GOAL #5  Title Patient will increase six minute walk test distance to >1000 for progression to community ambulator and improve gait ability   Time 12   Status New               Plan - 12/04/15 1610    Clinical Impression Statement  Patients performance improves with practice and she uses UE to help support and for balance. She continues to have static and dynamic standing deficits.   Pt will benefit from skilled therapeutic intervention in order to improve on the following deficits Abnormal gait;Decreased balance;Decreased endurance;Decreased mobility;Decreased strength;Pain   Rehab Potential Good   PT Frequency 2x / week   PT Duration 12 weeks   PT Treatment/Interventions Neuromuscular re-education;Balance training;Therapeutic exercise;Therapeutic activities;Gait training;Stair training   PT Next  Visit Plan Balance training   PT Home Exercise Plan HEP corner balance exercises   Consulted and Agree with Plan of Care Patient        Problem List There are no active problems to display for this patient.   [name] 12/04/2015, 10:26 AM  [place] [facility] MAIN [facility] SERVICES [address] [place], [place], [address] Phone: [phone]   Fax:  [phone]  Name: Angie Wilson MRN: [id number] Date of Birth: Apr 27, 1926

## 2015-12-06 ENCOUNTER — Encounter: Payer: Self-pay | Admitting: Physical Therapy

## 2015-12-06 ENCOUNTER — Ambulatory Visit: Payer: Medicare Other | Admitting: Physical Therapy

## 2015-12-06 DIAGNOSIS — R262 Difficulty in walking, not elsewhere classified: Secondary | ICD-10-CM

## 2015-12-06 DIAGNOSIS — R2681 Unsteadiness on feet: Secondary | ICD-10-CM | POA: Diagnosis not present

## 2015-12-06 DIAGNOSIS — R531 Weakness: Secondary | ICD-10-CM

## 2015-12-06 NOTE — Therapy (Signed)
[place] [facility] MAIN [facility] SERVICES [address] [place], [place], [address] Phone: [phone]   Fax:  [phone]  Physical Therapy Treatment  Patient Details  Name: Angie Wilson MRN: [id number] Date of Birth: 02-Jan-1926 Referring Provider: shah  Encounter Date: 12/06/2015      PT End of Session - 12/06/15 0855    Visit Number 5   Number of Visits 25   Date for PT Re-Evaluation 04-Mar-2016   Authorization Type g codes   PT Start Time 0830   PT Stop Time 0915   PT Time Calculation (min) 45 min   Equipment Utilized During Treatment Gait belt   Activity Tolerance Patient tolerated treatment well;No increased pain;Patient limited by fatigue   Behavior During Therapy [facility] for tasks assessed/performed      Past Medical History  Diagnosis Date  . Glaucoma     Past Surgical History  Procedure Laterality Date  . Abdominal hysterectomy      There were no vitals filed for this visit.  Visit Diagnosis:  Unsteady gait  Difficulty walking  Weakness      Subjective Assessment - 12/06/15 0854    Subjective Patient is doing well and reports no pain. She is learning to sequence with her spc.   Currently in Pain? No/denies         Gait training with spc and cuing for sequencing and gait speed . Gait training on TM 1.2 miles/hr and 10 minutes with cues for long steps and even steps  Neuromuscular Re-education   Marching in place on blue foam pad x 30 seconds for 2 sets   Tandem standing in // bars  x 1 minute Step ups to blue foam pad x 10 bilaterally for 2 sets bilaterally   Heel raises from 2 feet transitioning to TTWB on one foot. X 12 bilaterally for 1 set Sit to stand training x5 repetitions for 3 sets matrix x 5 reps fwd/bwd/side stepping 17.5 Patient needs occasional verbal cueing to improve posture and cueing to correctly perform exercises slowly.                         PT Education - 12/06/15 0854     Education provided Yes   Education Details HEP   Person(s) Educated Patient   Methods Explanation   Comprehension Verbalized understanding             PT Long Term Goals - 11/20/15 1127    PT LONG TERM GOAL #1   Title Patient will increase BLE gross strength to 4+/5 as to improve functional strength for independent gait, increased standing tolerance and increased ADL ability   Time 12   Period Weeks   Status New   PT LONG TERM GOAL #2   Title Patient will increase BLE gross strength to 4+/5 as to improve functional strength for independent gait, increased standing tolerance and increased ADL ability   Time 12   Period Weeks   Status New   PT LONG TERM GOAL #3   Title Patient will tolerate 5 seconds of single leg stance without loss of balance to improve ability to get in and out of shower safely   Time 12   Period Weeks   Status New   PT LONG TERM GOAL #4   Title Patient will reduce timed up and go to <11 seconds to reduce fall risk and demonstrate improved transfer/gait ability   Time 12   Period  Weeks   Status New   PT LONG TERM GOAL #5   Title Patient will increase six minute walk test distance to >1000 for progression to community ambulator and improve gait ability   Time 12   Status New               Plan - 12/06/15 0855    Clinical Impression Statement Patient instructed in gait training with spc and dynamic standing balance exercises  .Patient has unsteady stepping with several exercises and motor control  is impaired.    Pt will benefit from skilled therapeutic intervention in order to improve on the following deficits Abnormal gait;Decreased balance;Decreased endurance;Decreased mobility;Decreased strength;Pain   Rehab Potential Good   PT Frequency 2x / week   PT Duration 12 weeks   PT Treatment/Interventions Neuromuscular re-education;Balance training;Therapeutic exercise;Therapeutic activities;Gait training;Stair training   PT Next Visit Plan  Balance training   PT Home Exercise Plan HEP corner balance exercises   Consulted and Agree with Plan of Care Patient        Problem List There are no active problems to display for this patient.   [name] 12/06/2015, 9:04 AM  [place] [facility] MAIN [facility] SERVICES [address] [place], [place], [address] Phone: [phone]   Fax:  [phone]  Name: Angie Wilson MRN: [id number] Date of Birth: 1926/11/26

## 2015-12-11 ENCOUNTER — Ambulatory Visit: Payer: Medicare Other

## 2015-12-11 VITALS — BP 179/52 | HR 74

## 2015-12-11 DIAGNOSIS — R262 Difficulty in walking, not elsewhere classified: Secondary | ICD-10-CM

## 2015-12-11 DIAGNOSIS — R2681 Unsteadiness on feet: Secondary | ICD-10-CM

## 2015-12-11 DIAGNOSIS — R531 Weakness: Secondary | ICD-10-CM

## 2015-12-11 NOTE — Therapy (Signed)
[place] [facility] MAIN [facility] SERVICES [address] [place], [place], [address] Phone: [phone]   Fax:  [phone]  Physical Therapy Treatment  Patient Details  Name: Angie Wilson MRN: [id number] Date of Birth: 1926-01-21 Referring Provider: shah  Encounter Date: 12/11/2015      PT End of Session - 12/11/15 0921    Visit Number 6   Number of Visits 25   Date for PT Re-Evaluation 2016/02/21   Authorization Type g codes   PT Start Time 0835   PT Stop Time 0915   PT Time Calculation (min) 40 min   Equipment Utilized During Treatment Gait belt   Activity Tolerance Patient tolerated treatment well;No increased pain;Patient limited by fatigue   Behavior During Therapy [facility] for tasks assessed/performed      Past Medical History  Diagnosis Date  . Glaucoma     Past Surgical History  Procedure Laterality Date  . Abdominal hysterectomy      Filed Vitals:   12/11/15 0838  BP: 179/52  Pulse: 74  SpO2: 98%    Visit Diagnosis:  Unsteady gait  Difficulty walking  Weakness      Subjective Assessment - 12/11/15 0909    Subjective Pt reports she feels better today than she has lately. She is performing HEP without issue. No specific questions or concerns. Denies pain on this date.    Currently in Pain? No/denies        TREATMENT  THER-EX Heel raises without UE support 2 x 10; Mini squats with YTB around knees to prevent valgus 2 x 10; YTB side stepping in // bars 4 lengths x 2; Quantum single leg press 75# x 10, 90# x 10 bilateral, only able to complete x 5 during second set on R leg, pt surprised as she reports her R leg is typically stronger than her R ;  NEUROMUSCULAR RE-ED Standing slow marching without UE support 2 x 10; 6" step-ups in // bars without UE support x 10 bilateral, alternating; Airex WBOS eyes open/closed x 30 seconds each; Airex WBOS eyes opne horizontal and vertical head turns x 30 seconds each; Airex  NBOS eyes open/closed x 30 seconds each; Airex NBOS eyes opne horizontal and vertical head turns x 30 seconds each; Airex WBOS 6" step tapping 30 seconds x 2;                           PT Education - 12/11/15 0921    Education provided Yes   Education Details HEP reinforced, elevated BP   Person(s) Educated Child(ren);Patient   Methods Explanation;Demonstration   Comprehension Verbalized understanding;Returned demonstration             PT Long Term Goals - 11/20/15 1127    PT LONG TERM GOAL #1   Title Patient will increase BLE gross strength to 4+/5 as to improve functional strength for independent gait, increased standing tolerance and increased ADL ability   Time 12   Period Weeks   Status New   PT LONG TERM GOAL #2   Title Patient will increase BLE gross strength to 4+/5 as to improve functional strength for independent gait, increased standing tolerance and increased ADL ability   Time 12   Period Weeks   Status New   PT LONG TERM GOAL #3   Title Patient will tolerate 5 seconds of single leg stance without loss of balance to improve ability to get in and out of  shower safely   Time 12   Period Weeks   Status New   PT LONG TERM GOAL #4   Title Patient will reduce timed up and go to <11 seconds to reduce fall risk and demonstrate improved transfer/gait ability   Time 12   Period Weeks   Status New   PT LONG TERM GOAL #5   Title Patient will increase six minute walk test distance to >1000 for progression to community ambulator and improve gait ability   Time 12   Status New               Plan - 12/11/15 0909    Clinical Impression Statement Pt is somewhat impulsive during therapy session and does not do a great job following commands. She requires intermittent min to modA+1 during balance activities due to LOB. She particularly struggles with head turning and eyes closed on Airex pad in narrow stance. Pt encouraged to continue HEP and  follow-up as scheduled.  BP remains elevated at end of therapy session so    Pt will benefit from skilled therapeutic intervention in order to improve on the following deficits Abnormal gait;Decreased balance;Decreased endurance;Decreased mobility;Decreased strength;Pain   Rehab Potential Good   PT Frequency 2x / week   PT Duration 12 weeks   PT Treatment/Interventions Neuromuscular re-education;Balance training;Therapeutic exercise;Therapeutic activities;Gait training;Stair training   PT Next Visit Plan Balance training   PT Home Exercise Plan HEP corner balance exercises   Consulted and Agree with Plan of Care Patient        Problem List There are no active problems to display for this patient.  Lynnea Maizes PT, DPT   Amiyah Shryock 12/11/2015, 9:25 AM  [place] [facility] MAIN [facility] SERVICES [address] [place], [place], [address] Phone: [phone]   Fax:  [phone]  Name: JANISSA BERTRAM MRN: [id number] Date of Birth: 05-21-1926

## 2015-12-13 ENCOUNTER — Encounter: Payer: Self-pay | Admitting: Physical Therapy

## 2015-12-13 ENCOUNTER — Ambulatory Visit: Payer: Medicare Other

## 2015-12-13 VITALS — BP 148/52 | HR 68

## 2015-12-13 DIAGNOSIS — R262 Difficulty in walking, not elsewhere classified: Secondary | ICD-10-CM

## 2015-12-13 DIAGNOSIS — R2681 Unsteadiness on feet: Secondary | ICD-10-CM

## 2015-12-13 DIAGNOSIS — R531 Weakness: Secondary | ICD-10-CM

## 2015-12-13 NOTE — Therapy (Signed)
[facility] MAIN [facility] SERVICES [address] [place], [place], [address] Phone: [phone]   Fax:  [phone]  Physical Therapy Treatment  Patient Details  Name: Angie Wilson MRN: [id number] Date of Birth: 1926-10-02 Referring Provider: shah  Encounter Date: 12/13/2015      PT End of Session - 12/13/15 0835    Visit Number 7   Number of Visits 25   Date for PT Re-Evaluation 2016/03/03   Authorization Type g codes   PT Start Time 0828   PT Stop Time 0913   PT Time Calculation (min) 45 min   Equipment Utilized During Treatment Gait belt   Activity Tolerance Patient tolerated treatment well;No increased pain;Patient limited by fatigue   Behavior During Therapy [facility] for tasks assessed/performed      Past Medical History  Diagnosis Date  . Glaucoma     Past Surgical History  Procedure Laterality Date  . Abdominal hysterectomy      Filed Vitals:   12/13/15 0831  BP: 148/52  Pulse: 68  SpO2: 96%    Visit Diagnosis:  Unsteady gait  Difficulty walking  Weakness      Subjective Assessment - 12/13/15 0833    Subjective Pt states she is feeling well today without any questions or concerns. She is performing HEP "somewhat" at home. No specific questions or concerns.    Patient is accompained by: Family member   Currently in Pain? No/denies         TREATMENT  THER-EX Nustep L3 x 5 minutes for warm-up during history (3 minutes unbilled); Quantum double leg press 105# x 10, 120# x 10, 135# x 10; Heel raises with UE support and toes on 2x4, 2 x 10; Mini squats with RTB around knees to prevent valgus 2 x 10; RTB side stepping in // bars 4 lengths x 2; Sit to stand without UE support RTB around knees to prevent valgus 2 x 10;  NEUROMUSCULAR RE-ED Gait in hallway with horizontal and vertical head turns 80' x 2; Standing slow marching without UE support 2 x 10; Toe taps on BOSU x 10 bilateral, alternating; Modified  tandem stance eyes open/closed x 30 seconds each alternating LE; Modified tandem stance eyes open with horizontal and vertical head turns alternating LE;                        PT Education - 12/13/15 0834    Education provided Yes   Education Details HEP reinforced   Person(s) Educated Patient;Child(ren)   Methods Explanation;Demonstration   Comprehension Verbalized understanding;Returned demonstration             PT Long Term Goals - 11/20/15 1127    PT LONG TERM GOAL #1   Title Patient will increase BLE gross strength to 4+/5 as to improve functional strength for independent gait, increased standing tolerance and increased ADL ability   Time 12   Period Weeks   Status New   PT LONG TERM GOAL #2   Title Patient will increase BLE gross strength to 4+/5 as to improve functional strength for independent gait, increased standing tolerance and increased ADL ability   Time 12   Period Weeks   Status New   PT LONG TERM GOAL #3   Title Patient will tolerate 5 seconds of single leg stance without loss of balance to improve ability to get in and out of shower safely   Time 12   Period Weeks  Status New   PT LONG TERM GOAL #4   Title Patient will reduce timed up and go to <11 seconds to reduce fall risk and demonstrate improved transfer/gait ability   Time 12   Period Weeks   Status New   PT LONG TERM GOAL #5   Title Patient will increase six minute walk test distance to >1000 for progression to community ambulator and improve gait ability   Time 12   Status New               Plan - 12/13/15 9147    Clinical Impression Statement Pt demonstrates ability to increase resistance with leg press on this date. She continues to require intermittent min to modA+1 for support during neuromuscular re-education due to LOB. Pt remains somewhat impulsive during exercises. Pt encouraged to continue HEP and follow-up as scheduled.    Pt will benefit from skilled  therapeutic intervention in order to improve on the following deficits Abnormal gait;Decreased balance;Decreased endurance;Decreased mobility;Decreased strength;Pain   Rehab Potential Good   PT Frequency 2x / week   PT Duration 12 weeks   PT Treatment/Interventions Neuromuscular re-education;Balance training;Therapeutic exercise;Therapeutic activities;Gait training;Stair training   PT Next Visit Plan Balance training   PT Home Exercise Plan HEP corner balance exercises   Consulted and Agree with Plan of Care Patient        Problem List There are no active problems to display for this patient.  Lynnea Maizes PT, DPT   Joylyn Duggin 12/13/2015, 9:38 AM  [place] [facility] MAIN [facility] SERVICES [address] [place], [place], [address] Phone: [phone]   Fax:  [phone]  Name: JERICHA BRYDEN MRN: [id number] Date of Birth: 10-08-1926

## 2015-12-18 ENCOUNTER — Ambulatory Visit: Payer: Medicare Other | Attending: Neurology | Admitting: Physical Therapy

## 2015-12-18 ENCOUNTER — Encounter: Payer: Self-pay | Admitting: Physical Therapy

## 2015-12-18 DIAGNOSIS — R262 Difficulty in walking, not elsewhere classified: Secondary | ICD-10-CM | POA: Insufficient documentation

## 2015-12-18 DIAGNOSIS — R531 Weakness: Secondary | ICD-10-CM

## 2015-12-18 DIAGNOSIS — R2681 Unsteadiness on feet: Secondary | ICD-10-CM | POA: Insufficient documentation

## 2015-12-18 NOTE — Therapy (Signed)
[place] MAIN [facility] SERVICES [address] [place], [place], [address] Phone: [phone]   Fax:  [phone]  Physical Therapy Treatment  Patient Details  Name: Angie Wilson MRN: [id number] Date of Birth: 1977-03-24 Referring Provider: shah  Encounter Date: 12/18/2015      PT End of Session - 12/18/15 0845    Visit Number 8   Number of Visits 25   Date for PT Re-Evaluation 09-Mar-2016   Authorization Type g codes   PT Start Time 6962   PT Stop Time 0915   PT Time Calculation (min) 40 min   Equipment Utilized During Treatment Gait belt   Activity Tolerance Patient tolerated treatment well;No increased pain;Patient limited by fatigue   Behavior During Therapy [facility] for tasks assessed/performed      Past Medical History  Diagnosis Date  . Glaucoma     Past Surgical History  Procedure Laterality Date  . Abdominal hysterectomy      There were no vitals filed for this visit.  Visit Diagnosis:  Unsteady gait  Difficulty walking  Weakness      Subjective Assessment - 12/18/15 0844    Subjective Pt states she is feeling well today without any questions or concerns. She is performing HEP "somewhat" at home. No specific questions or concerns.    Patient is accompained by: Family member   Currently in Pain? No/denies      OUTCOME MEASURES: TEST Outcome Interpretation  5 times sit<>stand 11.94sec >60 yo, >15 sec indicates increased risk for falls  10 meter walk test  1.25 m/s <1.0 m/s indicates increased risk for falls; limited community ambulator  Timed up and Go  12.61 sec <14 sec indicates increased risk for falls  6 minute walk test  720 Feet 1000 feet is community ambulator               leg press x 90 lbs x 20 x 3, heel raises x 20 x 3 with 90 lbs Matrix fwd/bwd/side stepping  standing hip abd with YTB x 20  side stepping left and right in parallel bars with YTB  x 10 feet x 15 standing on blue foam with cone reaching x 20 across midline step ups from floor to 6 inch stool x 20 bilateral sit to stand x 10 marching in parallel bars x 20 .  CGA and Min to mod verbal cues used throughout with increased in postural sway and LOB most seen with narrow base of support and while on uneven surfaces. Continues to have balance deficits typical with diagnosis. Patient performs intermediate level exercises without pain behaviors and needs verbal cuing for postural alignment and head positioning Tactile cues and assistance needed to keep lower leg and knee in neutral to avoid compensations with ankle                           PT Education - 12/18/15 0845    Education provided Yes   Person(s) Educated Patient   Methods Explanation   Comprehension Verbalized understanding             PT Long Term Goals - 12/18/15 0849    PT LONG TERM GOAL #1   Status Partially Met   PT LONG TERM GOAL #2   Status Partially Met   PT LONG TERM GOAL #3   Status Partially Met   PT LONG TERM GOAL #5   Status Partially Met  Plan - Dec 30, 2015 0846    Clinical Impression Statement Patient needs min assist duirng dynamic standing balance training with loss of balance consistently with LE stepping activiites.    Pt will benefit from skilled therapeutic intervention in order to improve on the following deficits Abnormal gait;Decreased balance;Decreased endurance;Decreased mobility;Decreased strength;Pain   Rehab Potential Good   PT Frequency 2x / week   PT Duration 12 weeks   PT Treatment/Interventions Neuromuscular re-education;Balance training;Therapeutic exercise;Therapeutic activities;Gait training;Stair training   PT Next Visit Plan Balance training   PT Home Exercise Plan HEP corner balance exercises   Consulted and Agree with Plan of Care Patient          G-Codes - 12/30/2015 0849    Functional Assessment Tool Used 5 x sit  to stand, TUG, 10 MW, 6 MW   Functional Limitation Mobility: Walking and moving around   Mobility: Walking and Moving Around Current Status [phone]) At least 40 percent but less than 60 percent impaired, limited or restricted   Mobility: Walking and Moving Around Goal Status [phone]) At least 20 percent but less than 40 percent impaired, limited or restricted      Problem List There are no active problems to display for this patient.  Alanson Puls, PT, DPT [place], Minette Headland S Dec 30, 2015, 8:51 AM  [place] MAIN [facility] SERVICES [address] [place], [place], [address] Phone: [phone]   Fax:  [phone]  Name: KAIDENCE SANT MRN: [id number] Date of Birth: 1977/07/30

## 2015-12-20 ENCOUNTER — Ambulatory Visit: Payer: Medicare Other | Admitting: Physical Therapy

## 2015-12-20 ENCOUNTER — Encounter: Payer: Self-pay | Admitting: Physical Therapy

## 2015-12-20 DIAGNOSIS — R2681 Unsteadiness on feet: Secondary | ICD-10-CM

## 2015-12-20 DIAGNOSIS — R262 Difficulty in walking, not elsewhere classified: Secondary | ICD-10-CM

## 2015-12-20 DIAGNOSIS — R531 Weakness: Secondary | ICD-10-CM

## 2015-12-20 NOTE — Therapy (Signed)
[place] MAIN [facility] SERVICES [address] [place], [place], [address] Phone: [phone]   Fax:  [phone]  Physical Therapy Treatment  Patient Details  Name: Angie Wilson MRN: [id number] Date of Birth: 1926-03-05 Referring Provider: shah  Encounter Date: 12/20/2015      PT End of Session - 12/20/15 0835    Visit Number 9   Number of Visits 25   Date for PT Re-Evaluation 02-17-16   Authorization Type g codes   Equipment Utilized During Treatment Gait belt   Activity Tolerance Patient tolerated treatment well;No increased pain;Patient limited by fatigue   Behavior During Therapy [facility] for tasks assessed/performed      Past Medical History  Diagnosis Date  . Glaucoma     Past Surgical History  Procedure Laterality Date  . Abdominal hysterectomy      There were no vitals filed for this visit.  Visit Diagnosis:  Unsteady gait  Difficulty walking  Weakness      Subjective Assessment - 12/20/15 0834    Subjective Pt states she is feeling well today without any questions or concerns. She is performing HEP "somewhat" at home. No specific questions or concerns.    Patient is accompained by: Family member         Neuromuscular Re-education   Marching in place on blue foam pad x 30 seconds for 2 sets   Tandem standing in // bars  x 1 minute Step ups to blue foam pad x 10 bilaterally for 2 sets bilaterally   Heel raises from 2 feet transitioning to TTWB on one foot. X 12 bilaterally for 1 set Sit to stand training x5 repetitions for 3 sets biodex x 5 reps fwd/bwd/side stepping Four square stepping.side to side, front and back and diagonals Feet together on blue foam and holding ball x 1 minute, holding ball and fwd and bwd movement elbow flex/ext x 20, horizontal abd/add with ball and arms extended. balloon toss at parallel bars x 5 minutes Therapeutic exercise; Leg press x 20 x 3 130 lbs, heel raises with 90 lbs x 20 x  3 Heel raises standing  4 way hip RTB x 20                         PT Education - 12/20/15 0834    Education provided Yes   Education Details REviewed HEP and goals   Person(s) Educated Patient   Methods Explanation   Comprehension Verbalized understanding             PT Long Term Goals - 12/18/15 0849    PT LONG TERM GOAL #1   Status Partially Met   PT LONG TERM GOAL #2   Status Partially Met   PT LONG TERM GOAL #3   Status Partially Met   PT LONG TERM GOAL #5   Status Partially Met               Plan - 12/20/15 0835    Clinical Impression Statement CGA to SBA for safety with activities.  Uses to increase intensity and amplitude of movements throughout session.   Pt will benefit from skilled therapeutic intervention in order to improve on the following deficits Abnormal gait;Decreased balance;Decreased endurance;Decreased mobility;Decreased strength;Pain   Rehab Potential Good   PT Frequency 2x / week   PT Duration 12 weeks   PT Treatment/Interventions Neuromuscular re-education;Balance training;Therapeutic exercise;Therapeutic activities;Gait training;Stair training   PT Next Visit Plan  Balance training   PT Home Exercise Plan HEP corner balance exercises   Consulted and Agree with Plan of Care Patient        Problem List There are no active problems to display for this patient.   Alanson Puls 12/20/2015, 8:39 AM  [place] MAIN [place] [facility] SERVICES [address] [place], [place], [address] Phone: [phone]   Fax:  [phone]  Name: Angie Wilson MRN: [id number] Date of Birth: 12/03/1926

## 2015-12-25 ENCOUNTER — Encounter: Payer: Self-pay | Admitting: Physical Therapy

## 2015-12-25 ENCOUNTER — Ambulatory Visit: Payer: Medicare Other | Admitting: Physical Therapy

## 2015-12-25 DIAGNOSIS — R262 Difficulty in walking, not elsewhere classified: Secondary | ICD-10-CM

## 2015-12-25 DIAGNOSIS — R2681 Unsteadiness on feet: Secondary | ICD-10-CM

## 2015-12-25 DIAGNOSIS — R531 Weakness: Secondary | ICD-10-CM

## 2015-12-25 NOTE — Therapy (Signed)
[place] MAIN [facility] SERVICES [address] [place], [place], [address] Phone: [phone]   Fax:  [phone]  Physical Therapy Treatment  Patient Details  Name: Angie Wilson MRN: [id number] Date of Birth: 04-Feb-1926 Referring Provider: shah  Encounter Date: 12/25/2015      PT End of Session - 12/25/15 1316    Visit Number 10   Number of Visits 25   Date for PT Re-Evaluation 03-09-2016   Authorization Type g codes   Equipment Utilized During Treatment Gait belt   Activity Tolerance Patient tolerated treatment well;No increased pain;Patient limited by fatigue   Behavior During Therapy [facility] for tasks assessed/performed      Past Medical History  Diagnosis Date  . Glaucoma     Past Surgical History  Procedure Laterality Date  . Abdominal hysterectomy      There were no vitals filed for this visit.  Visit Diagnosis:  Unsteady gait  Difficulty walking  Weakness      Subjective Assessment - 12/25/15 1315    Subjective Patient has been doing well, she is stumbling at home.   Patient is accompained by: Family member        standing hip abd with YTB x 20  side stepping left and right in parallel bars 10 feet x 3 standing on blue foam with cone reaching x 20 across midline step ups from floor to 6 inch stool x 20 bilateral sit to stand x 10 marching in parallel bars x 20 stepping pattern with weight shifting fwd/bwd x 10.  Min cueing needed to appropriately perform  tasks with leg, hand, and head position. Decreased coordination demonstrated requiring consistent verbal cueing to correct form.  Patient continues to demonstrate some lack in coordination of movement with select exercises. Patient responds well to verbal and tactile cues to correct form and technique.  CGA to SBA for safety with activities.  Uses to increase intensity of movements throughout session                          PT Education -  12/25/15 1316    Education provided Yes   Person(s) Educated Patient   Methods Explanation   Comprehension Verbalized understanding             PT Long Term Goals - 12/18/15 0849    PT LONG TERM GOAL #1   Status Partially Met   PT LONG TERM GOAL #2   Status Partially Met   PT LONG TERM GOAL #3   Status Partially Met   PT LONG TERM GOAL #5   Status Partially Met               Plan - 12/25/15 1316    Clinical Impression Statement Minimal cueing needed to point toe forward and keep knee straight with task. CGA to SBA for safety with activities.  Uses to increase intensity and amplitude of movements throughout session.   Pt will benefit from skilled therapeutic intervention in order to improve on the following deficits Abnormal gait;Decreased balance;Decreased endurance;Decreased mobility;Decreased strength;Pain   Rehab Potential Good   PT Frequency 2x / week   PT Duration 12 weeks   PT Treatment/Interventions Neuromuscular re-education;Balance training;Therapeutic exercise;Therapeutic activities;Gait training;Stair training   PT Next Visit Plan Balance training   PT Home Exercise Plan HEP corner balance exercises   Consulted and Agree with Plan of Care Patient        Problem List  There are no active problems to display for this patient.   Arelia Sneddon S 12/25/2015, 1:20 PM  [place] MAIN [facility] SERVICES [address] [place], [place], [address] Phone: [phone]   Fax:  [phone]  Name: Angie Wilson MRN: [id number] Date of Birth: July 29, 1926

## 2015-12-27 ENCOUNTER — Ambulatory Visit: Payer: Medicare Other | Admitting: Physical Therapy

## 2015-12-27 DIAGNOSIS — R2681 Unsteadiness on feet: Secondary | ICD-10-CM | POA: Diagnosis not present

## 2015-12-27 DIAGNOSIS — R262 Difficulty in walking, not elsewhere classified: Secondary | ICD-10-CM

## 2015-12-27 DIAGNOSIS — R531 Weakness: Secondary | ICD-10-CM

## 2015-12-27 NOTE — Therapy (Signed)
[place] MAIN [facility] SERVICES [address] [place], [place], [address] Phone: [phone]   Fax:  [phone]  Physical Therapy Treatment  Patient Details  Name: Angie Wilson MRN: [id number] Date of Birth: 12/02/1926 Referring Provider: shah  Encounter Date: 12/27/2015      PT End of Session - 12/27/15 0839    Visit Number 11   Number of Visits 25   Date for PT Re-Evaluation 03/11/2016   Authorization Type g codes   PT Start Time 8768   PT Stop Time 0915   PT Time Calculation (min) 40 min   Equipment Utilized During Treatment Gait belt   Activity Tolerance Patient tolerated treatment well;No increased pain;Patient limited by fatigue   Behavior During Therapy [facility] for tasks assessed/performed      Past Medical History  Diagnosis Date  . Glaucoma     Past Surgical History  Procedure Laterality Date  . Abdominal hysterectomy      There were no vitals filed for this visit.  Visit Diagnosis:  Unsteady gait  Difficulty walking  Weakness      Subjective Assessment - 12/27/15 0838    Subjective Patient has been doing well, she is stumbling at home.   Patient is accompained by: Family member      [place] Standing exercises with 2# ankle weights: Marching 2 x 10; SLR 2 x 10; Abduction 2 x 10; Extension 2 x 10; Knee flexion 2 x 10; Heel raises 2 x 10;  Resisted side-steeping RTB 4 lengths x 2; Standing mini squats 2 x 10 with RTB around knees to encourage abduction; Sit to stand without UE support 2 x 10; Step-ups to 6" step x 10 bilateral; Quantum leg press 105# x 10, 120# x 10;  NEUROMUSCULAR RE-EDUCATION Airex NBOS eyes open/closed x 30 seconds each; Airex NBOS eyes open horizontal and vertical head turns x 30 seconds; Airex cone taps alternating LE x 60 seconds; Tandem gait in // bars x 4 laps PT provided min - moderate verbal instruction to improve set up, proper use of LE, and improved posture and gait  mechanics. Patient responded moderately to instruction                            PT Education - 12/27/15 0839    Education provided Yes   Education Details HEP   Person(s) Educated Patient   Methods Explanation   Comprehension Verbalized understanding             PT Long Term Goals - 12/18/15 0849    PT LONG TERM GOAL #1   Status Partially Met   PT LONG TERM GOAL #2   Status Partially Met   PT LONG TERM GOAL #3   Status Partially Met   PT LONG TERM GOAL #5   Status Partially Met               Plan - 12/27/15 0839    Clinical Impression Statement CGA to SBA for safety with activities.  Uses to increase intensity and amplitude of movements throughout session.   Pt will benefit from skilled therapeutic intervention in order to improve on the following deficits Abnormal gait;Decreased balance;Decreased endurance;Decreased mobility;Decreased strength;Pain   Rehab Potential Good   PT Frequency 2x / week   PT Duration 12 weeks   PT Treatment/Interventions Neuromuscular re-education;Balance training;Therapeutic exercise;Therapeutic activities;Gait training;Stair training   PT Next Visit Plan Balance training   PT  Home Exercise Plan HEP corner balance exercises   Consulted and Agree with Plan of Care Patient        Problem List There are no active problems to display for this patient.   Alanson Puls 12/27/2015, 8:42 AM  [place] MAIN [facility] SERVICES [address] [place], [place], [address] Phone: [phone]   Fax:  [phone]  Name: Angie Wilson MRN: [id number] Date of Birth: [birth year]

## 2016-01-01 ENCOUNTER — Encounter: Payer: Self-pay | Admitting: Physical Therapy

## 2016-01-01 ENCOUNTER — Ambulatory Visit: Payer: Medicare Other | Admitting: Physical Therapy

## 2016-01-01 DIAGNOSIS — R531 Weakness: Secondary | ICD-10-CM

## 2016-01-01 DIAGNOSIS — R2681 Unsteadiness on feet: Secondary | ICD-10-CM

## 2016-01-01 DIAGNOSIS — R262 Difficulty in walking, not elsewhere classified: Secondary | ICD-10-CM

## 2016-01-01 NOTE — Therapy (Signed)
[place] MAIN [facility] SERVICES [address] [place], [place], [address] Phone: [phone]   Fax:  [phone]  Physical Therapy Treatment  Patient Details  Name: Angie Wilson MRN: [id number] Date of Birth: [birth year] Referring Provider: shah  Encounter Date: 01/01/2016      PT End of Session - 01/01/16 0907    Visit Number 12   Number of Visits 25   Date for PT Re-Evaluation 2016-03-04   Authorization Type g codes   PT Start Time 0830   PT Stop Time 0915   PT Time Calculation (min) 45 min   Equipment Utilized During Treatment Gait belt   Activity Tolerance Patient tolerated treatment well;No increased pain;Patient limited by fatigue   Behavior During Therapy [facility] for tasks assessed/performed      Past Medical History  Diagnosis Date  . Glaucoma     Past Surgical History  Procedure Laterality Date  . Abdominal hysterectomy      There were no vitals filed for this visit.  Visit Diagnosis:  Unsteady gait  Difficulty walking  Weakness      Subjective Assessment - 01/01/16 0903    Subjective Patient has been doing well, she is stumbling at home.   Patient is accompained by: Family member      [place] Standing exercises with 2# ankle weights: Marching 2 x 10; SLR 2 x 10; Abduction 2 x 10; Extension 2 x 10; Knee flexion 2 x 10; Heel raises 2 x 10;  Resisted side-steeping RTB 4 lengths x 2; Standing mini squats 2 x 10 with RTB around knees to encourage abduction; Sit to stand without UE support 2 x 10; Step-ups to 6" step x 10 bilateral; Quantum leg press 105# x 10, 120# x 10 NEUROMUSCULAR RE-EDUCATION Airex NBOS eyes open/closed x 30 seconds each; Airex NBOS eyes open horizontal and vertical head turns x 30 seconds; Airex cone taps alternating LE x 60 seconds; Four square fwd/bwd, side to side and diagonals Tandem gait in // bars x 4 laps CGA and Min to mod verbal cues used throughout with increased in  postural sway and LOB most seen with narrow base of support and while on uneven surfaces. Continues to have balance deficits typical with diagnosis. Patient performs intermediate level exercises without pain behaviors and needs verbal cuing for postural alignment and head positioning Tactile cues and assistance needed to keep lower leg and knee in neutral to avoid compensations with ankle motions.                           PT Education - 01/01/16 0907    Education provided Yes   Education Details HEP   Person(s) Educated Patient   Methods Explanation   Comprehension Verbalized understanding             PT Long Term Goals - 12/18/15 0849    PT LONG TERM GOAL #1   Status Partially Met   PT LONG TERM GOAL #2   Status Partially Met   PT LONG TERM GOAL #3   Status Partially Met   PT LONG TERM GOAL #5   Status Partially Met               Plan - 01/01/16 0908    Clinical Impression Statement Continues to have balance deficits typical with diagnosis. Patient performs intermediate level exercises without pain behaviors and needs verbal cuing for postural alignment and head positioning.  Pt will benefit from skilled therapeutic intervention in order to improve on the following deficits Abnormal gait;Decreased balance;Decreased endurance;Decreased mobility;Decreased strength;Pain   Rehab Potential Good   PT Frequency 2x / week   PT Duration 12 weeks   PT Treatment/Interventions Neuromuscular re-education;Balance training;Therapeutic exercise;Therapeutic activities;Gait training;Stair training   PT Next Visit Plan Balance training   PT Home Exercise Plan HEP corner balance exercises   Consulted and Agree with Plan of Care Patient        Problem List There are no active problems to display for this patient.  Alanson Puls, PT, DPT [place], Minette Headland S 01/01/2016, 9:09 AM  [place] MAIN [facility] SERVICES [address] [place], [place], [address] Phone: [phone]   Fax:  [phone]  Name: Angie Wilson MRN: [id number] Date of Birth: 1926-01-17

## 2016-01-02 ENCOUNTER — Encounter: Payer: Self-pay | Admitting: Physical Therapy

## 2016-01-02 ENCOUNTER — Ambulatory Visit: Payer: Medicare Other | Admitting: Physical Therapy

## 2016-01-02 DIAGNOSIS — R2681 Unsteadiness on feet: Secondary | ICD-10-CM | POA: Diagnosis not present

## 2016-01-02 DIAGNOSIS — R531 Weakness: Secondary | ICD-10-CM

## 2016-01-02 DIAGNOSIS — R262 Difficulty in walking, not elsewhere classified: Secondary | ICD-10-CM

## 2016-01-02 NOTE — Therapy (Signed)
[place] MAIN [facility] SERVICES [address] [place], [place], [address] Phone: [phone]   Fax:  [phone]  Physical Therapy Treatment  Patient Details  Name: Angie Wilson MRN: [id number] Date of Birth: [birth year] Referring Provider: shah  Encounter Date: 01/02/2016      PT End of Session - 01/02/16 0855    Visit Number 13   Number of Visits 25   Date for PT Re-Evaluation 03/13/16   Authorization Type g codes   PT Start Time 0835   PT Stop Time 0915   PT Time Calculation (min) 40 min   Equipment Utilized During Treatment Gait belt   Activity Tolerance Patient tolerated treatment well;No increased pain;Patient limited by fatigue   Behavior During Therapy [name] for tasks assessed/performed      Past Medical History  Diagnosis Date  . Glaucoma     Past Surgical History  Procedure Laterality Date  . Abdominal hysterectomy      There were no vitals filed for this visit.  Visit Diagnosis:  Unsteady gait  Difficulty walking  Weakness      Subjective Assessment - 01/02/16 0854    Subjective Patient has been doing well, she is stumbling at home.   Patient is accompained by: Family member   Currently in Pain? No/denies      THER-EX Nustep L3 x 5 minutes for warm-up during history (3 minutes unbilled); Quantum double leg press 105# x 10, 120# x 10, 135# x 10; Heel raises with UE support and toes on 2x4, 2 x 10; Mini squats with RTB around knees to prevent valgus 2 x 10; RTB side stepping in // bars 4 lengths x 2; Sit to stand without UE support RTB around knees to prevent valgus 2 x 10;  NEUROMUSCULAR RE-ED Gait in hallway with horizontal and vertical head turns 80' x 2; Standing slow marching without UE support 2 x 10; Toe taps on BOSU x 10 bilateral, alternating; Modified tandem stance eyes open/closed x 30 seconds each alternating LE; Modified tandem stance eyes open with horizontal and vertical head turns  alternating LE Patient needs occasional verbal cueing to improve posture and cueing to correctly perform exercises slowly, holding at end of range to increase motor firing of desired muscle to encourage fatigue.                             PT Education - 01/02/16 0854    Education provided Yes   Education Details HEP   Person(s) Educated Patient   Methods Explanation   Comprehension Verbalized understanding             PT Long Term Goals - 12/18/15 0849    PT LONG TERM GOAL #1   Status Partially Met   PT LONG TERM GOAL #2   Status Partially Met   PT LONG TERM GOAL #3   Status Partially Met   PT LONG TERM GOAL #5   Status Partially Met               Plan - 01/02/16 0855    Clinical Impression Statement Patient has unsteady stepping with several exercises but his motor control improve with practice   Pt will benefit from skilled therapeutic intervention in order to improve on the following deficits Abnormal gait;Decreased balance;Decreased endurance;Decreased mobility;Decreased strength;Pain   Rehab Potential Good   PT Frequency 2x / week   PT Duration 12 weeks  PT Treatment/Interventions Neuromuscular re-education;Balance training;Therapeutic exercise;Therapeutic activities;Gait training;Stair training   PT Next Visit Plan Balance training   PT Home Exercise Plan HEP corner balance exercises   Consulted and Agree with Plan of Care Patient        Problem List There are no active problems to display for this patient.  Alanson Puls, PT, DPT [place], Minette Headland S 01/02/2016, 9:01 AM  [address] MAIN [facility] SERVICES [address] [place], [place], [address] Phone: [phone]   Fax:  [phone]  Name: Angie Wilson MRN: [id number] Date of Birth: 1926-11-27

## 2016-01-08 ENCOUNTER — Encounter: Payer: Self-pay | Admitting: Physical Therapy

## 2016-01-08 ENCOUNTER — Ambulatory Visit: Payer: Medicare Other | Admitting: Physical Therapy

## 2016-01-08 DIAGNOSIS — R2681 Unsteadiness on feet: Secondary | ICD-10-CM

## 2016-01-08 DIAGNOSIS — R262 Difficulty in walking, not elsewhere classified: Secondary | ICD-10-CM

## 2016-01-08 DIAGNOSIS — R531 Weakness: Secondary | ICD-10-CM

## 2016-01-08 NOTE — Therapy (Signed)
[place] MAIN [facility] SERVICES [address] [place], [place], [address] Phone: [phone]   Fax:  [phone]  Physical Therapy Treatment  Patient Details  Name: Angie Wilson MRN: [id number] Date of Birth: 03/26/1926 Referring Provider: shah  Encounter Date: 01/08/2016      PT End of Session - 01/08/16 0840    Visit Number 14   Number of Visits 25   Date for PT Re-Evaluation 03/11/2016   Authorization Type g codes   PT Start Time 0830   PT Stop Time 0915   PT Time Calculation (min) 45 min   Equipment Utilized During Treatment Gait belt   Activity Tolerance Patient tolerated treatment well;No increased pain;Patient limited by fatigue   Behavior During Therapy [facility] for tasks assessed/performed      Past Medical History  Diagnosis Date  . Glaucoma     Past Surgical History  Procedure Laterality Date  . Abdominal hysterectomy      There were no vitals filed for this visit.  Visit Diagnosis:  Unsteady gait  Difficulty walking  Weakness      Subjective Assessment - 01/08/16 0839    Subjective Patient has been doing well, she is stumbling at home.   Patient is accompained by: Family member   Currently in Pain? No/denies         THER-EX Standing exercises with 2# ankle weights: Marching 2 x 10; SLR 2 x 10; Abduction 2 x 10; Extension 2 x 10; Knee flexion 2 x 10; Heel raises 2 x 10;  Resisted side-steeping RTB 4 lengths x 2; Standing mini squats 2 x 10 with RTB around knees to encourage abduction; Sit to stand without UE support 2 x 10; Step-ups to 6" step x 10 bilateral; Quantum leg press 105# x 10, 120# x 10;  NEUROMUSCULAR RE-EDUCATION Airex NBOS eyes open/closed x 30 seconds each; Airex NBOS eyes open horizontal and vertical head turns x 30 seconds; Airex cone taps alternating LE x 60 seconds; Tandem gait in // bars x 4 laps Strengthening cues Matrix fwd/bwd and side stepping 2. 5 lbs PT provided min -  moderate verbal instruction to improve set up, proper use of LE, and improved posture and gait mechanics. Patient responded moderately to instruction                         PT Education - 01/08/16 0839    Education provided Yes   Education Details HEP   Methods Explanation   Comprehension Verbalized understanding             PT Long Term Goals - 12/18/15 0849    PT LONG TERM GOAL #1   Status Partially Met   PT LONG TERM GOAL #2   Status Partially Met   PT LONG TERM GOAL #3   Status Partially Met   PT LONG TERM GOAL #5   Status Partially Met               Plan - 01/08/16 0840    Clinical Impression Statement Fatigue with sit to stand but demonstrating more control, Increase weight for standing exercises. Fatigue still evident with cross trainer and endurance.    Pt will benefit from skilled therapeutic intervention in order to improve on the following deficits Abnormal gait;Decreased balance;Decreased endurance;Decreased mobility;Decreased strength;Pain   Rehab Potential Good   PT Frequency 2x / week   PT Duration 12 weeks   PT Treatment/Interventions Neuromuscular re-education;Balance  training;Therapeutic exercise;Therapeutic activities;Gait training;Stair training   PT Next Visit Plan Balance training   PT Home Exercise Plan HEP corner balance exercises   Consulted and Agree with Plan of Care Patient        Problem List There are no active problems to display for this patient.  Alanson Puls, PT, DPT [place], Minette Headland S 01/08/2016, 9:00 AM  [place] MAIN [facility] SERVICES [address] [place], [place], [address] Phone: [phone]   Fax:  [phone]  Name: Angie Wilson MRN: [id number] Date of Birth: 23-Mar-1926

## 2016-01-10 ENCOUNTER — Encounter: Payer: Self-pay | Admitting: Physical Therapy

## 2016-01-10 ENCOUNTER — Ambulatory Visit: Payer: Medicare Other | Admitting: Physical Therapy

## 2016-01-10 DIAGNOSIS — R262 Difficulty in walking, not elsewhere classified: Secondary | ICD-10-CM

## 2016-01-10 DIAGNOSIS — R2681 Unsteadiness on feet: Secondary | ICD-10-CM | POA: Diagnosis not present

## 2016-01-10 DIAGNOSIS — R531 Weakness: Secondary | ICD-10-CM

## 2016-01-10 NOTE — Therapy (Signed)
[place] MAIN [facility] SERVICES [address] [place], [place], [address] Phone: [phone]   Fax:  [phone]  Physical Therapy Treatment  Patient Details  Name: GILBERTE GORLEY MRN: [id number] Date of Birth: [birth year] Referring Provider: shah  Encounter Date: 01/10/2016      PT End of Session - 01/10/16 0843    Visit Number 15   Number of Visits 25   Date for PT Re-Evaluation March 10, 2016   Authorization Type g codes   PT Start Time 0830   PT Stop Time 0915   PT Time Calculation (min) 45 min   Equipment Utilized During Treatment Gait belt   Activity Tolerance Patient tolerated treatment well;No increased pain;Patient limited by fatigue   Behavior During Therapy [facility] for tasks assessed/performed      Past Medical History  Diagnosis Date  . Glaucoma     Past Surgical History  Procedure Laterality Date  . Abdominal hysterectomy      There were no vitals filed for this visit.  Visit Diagnosis:  Unsteady gait  Difficulty walking  Weakness      Subjective Assessment - 01/10/16 0842    Subjective Patient has been doing well, she is stumbling at home.   Patient is accompained by: Family member      standing hip abd with YTB x 20  side stepping left and right in parallel bars 10 feet x 3 standing on blue foam with cone reaching x 20 across midline step ups from floor to 6 inch stool x 20 bilateral sit to stand x 10 marching in parallel bars x 20 stepping pattern with weight shifting fwd/bwd x 10.  NEUROMUSCULAR RE-EDUCATION Airex NBOS eyes open/closed x 30 seconds each; Airex NBOS eyes open horizontal and vertical head turns x 30 seconds; Airex cone taps alternating LE x 60 seconds; Tandem gait in // bars x 4 laps Min cueing needed to appropriately perform LSVT tasks with leg, hand, and head position. Decreased coordination demonstrated requiring consistent verbal cueing to correct form.  Patient continues to demonstrate  some in coordination of movement with select exercises such as rock and reach and stepping backwards. Patient responds well to verbal and tactile cues to correct form and technique.  CGA to SBA for safety with activities.  Uses to increase intensity and amplitude of movements throughout session                            PT Education - 01/10/16 0843    Education provided Yes   Education Details HEP   Person(s) Educated Patient   Methods Explanation   Comprehension Verbalized understanding             PT Long Term Goals - 12/18/15 0849    PT LONG TERM GOAL #1   Status Partially Met   PT LONG TERM GOAL #2   Status Partially Met   PT LONG TERM GOAL #3   Status Partially Met   PT LONG TERM GOAL #5   Status Partially Met               Plan - 01/10/16 0843    Clinical Impression Statement Motor control of LE much improved   Pt will benefit from skilled therapeutic intervention in order to improve on the following deficits Abnormal gait;Decreased balance;Decreased endurance;Decreased mobility;Decreased strength;Pain   Rehab Potential Good   PT Frequency 2x / week   PT Duration 12 weeks  PT Treatment/Interventions Neuromuscular re-education;Balance training;Therapeutic exercise;Therapeutic activities;Gait training;Stair training   PT Next Visit Plan Balance training   PT Home Exercise Plan HEP corner balance exercises   Consulted and Agree with Plan of Care Patient        Problem List There are no active problems to display for this patient. Alanson Puls, PT, DPT  [place], Minette Headland S 01/10/2016, 8:45 AM  [place] MAIN [facility] SERVICES [address] [place], [place], [address] Phone: [phone]   Fax:  [phone]  Name: JUDIA ARNOTT MRN: [id number] Date of Birth: [birth year]

## 2016-01-15 ENCOUNTER — Ambulatory Visit: Payer: Medicare Other | Admitting: Physical Therapy

## 2016-01-15 ENCOUNTER — Encounter: Payer: Self-pay | Admitting: Physical Therapy

## 2016-01-15 DIAGNOSIS — R531 Weakness: Secondary | ICD-10-CM

## 2016-01-15 DIAGNOSIS — R2681 Unsteadiness on feet: Secondary | ICD-10-CM | POA: Diagnosis not present

## 2016-01-15 DIAGNOSIS — R262 Difficulty in walking, not elsewhere classified: Secondary | ICD-10-CM

## 2016-01-15 NOTE — Therapy (Signed)
[place] MAIN [facility] SERVICES [address] [place], [place], [address] Phone: [phone]   Fax:  [phone]  Physical Therapy Treatment  Patient Details  Name: Angie Wilson MRN: [id number] Date of Birth: June 01, 1926 Referring Provider: shah  Encounter Date: 01/15/2016      PT End of Session - 01/15/16 1109    Visit Number 16   Number of Visits 25   Date for PT Re-Evaluation 03-07-2016   Authorization Type g codes   PT Start Time 0830   PT Stop Time 0915   PT Time Calculation (min) 45 min   Equipment Utilized During Treatment Gait belt   Activity Tolerance Patient tolerated treatment well;No increased pain;Patient limited by fatigue   Behavior During Therapy [facility] for tasks assessed/performed      Past Medical History  Diagnosis Date  . Glaucoma     Past Surgical History  Procedure Laterality Date  . Abdominal hysterectomy      There were no vitals filed for this visit.  Visit Diagnosis:  Unsteady gait  Difficulty walking  Weakness      Subjective Assessment - 01/15/16 1108    Subjective Patient has been doing well, she is stumbling at home.   Patient is accompained by: Family member   Currently in Pain? No/denies      Neuromuscular Re-education   Marching in place on blue foam pad x 30 seconds for 2 sets   Tandem standing in // bars  x 1 minute Step ups to blue foam pad x 10 bilaterally for 2 sets bilaterally   Heel raises from 2 feet transitioning to TTWB on one foot. X 12 bilaterally for 1 set Sit to stand training x5 repetitions for 3 sets biodex x 5 reps fwd/bwd/side stepping Four square stepping.side to side, front and back and diagonals Feet together on blue foam and holding ball x 1 minute, holding ball and fwd and bwd movement elbow flex/ext x 20, horizontal abd/add with ball and arms extended. balloon toss at parallel bars x 5 minutes  Patient needs occasional verbal cueing to improve posture and  cueing to correctly perform exercises slowly, holding at end of range to increase motor firing of desired muscle to encourage fatigue.                           PT Education - 01/15/16 1108    Education provided Yes   Education Details HEP   Person(s) Educated Patient   Methods Explanation   Comprehension Verbalized understanding             PT Long Term Goals - 12/18/15 0849    PT LONG TERM GOAL #1   Status Partially Met   PT LONG TERM GOAL #2   Status Partially Met   PT LONG TERM GOAL #3   Status Partially Met   PT LONG TERM GOAL #5   Status Partially Met               Plan - 01/15/16 1109    Clinical Impression Statement Patient is able to catch mistakes in technique with incorrect positions and is able remember the start and finish positions   Pt will benefit from skilled therapeutic intervention in order to improve on the following deficits Abnormal gait;Decreased balance;Decreased endurance;Decreased mobility;Decreased strength;Pain   Rehab Potential Good   PT Frequency 2x / week   PT Duration 12 weeks   PT Treatment/Interventions Neuromuscular re-education;Balance  training;Therapeutic exercise;Therapeutic activities;Gait training;Stair training   PT Next Visit Plan Balance training   PT Home Exercise Plan HEP corner balance exercises   Consulted and Agree with Plan of Care Patient        Problem List There are no active problems to display for this patient. Alanson Puls, PT, DPT  [place], Minette Headland S 01/15/2016, 11:13 AM  [place] MAIN [facility] SERVICES [address] [place], [place], [address] Phone: [phone]   Fax:  [phone]  Name: ODELLE KOSIER MRN: [id number] Date of Birth: [birth year]

## 2016-01-17 ENCOUNTER — Ambulatory Visit: Payer: Medicare Other | Attending: Neurology | Admitting: Physical Therapy

## 2016-01-17 DIAGNOSIS — R531 Weakness: Secondary | ICD-10-CM | POA: Insufficient documentation

## 2016-01-17 DIAGNOSIS — R262 Difficulty in walking, not elsewhere classified: Secondary | ICD-10-CM | POA: Diagnosis present

## 2016-01-17 DIAGNOSIS — R2681 Unsteadiness on feet: Secondary | ICD-10-CM | POA: Insufficient documentation

## 2016-01-17 NOTE — Therapy (Addendum)
[place] MAIN [facility] SERVICES [address] [place], [place], [address] Phone: [phone]   Fax:  [phone]  Physical Therapy Treatment  Patient Details  Name: Angie Wilson MRN: [id number] Date of Birth: [birth year] Referring Provider: shah  Encounter Date: 01/17/2016      PT End of Session - 01/17/16 0858    Visit Number 16   Number of Visits 25   Date for PT Re-Evaluation 2016-02-26   Authorization Type g codes   PT Start Time 0830   PT Stop Time 0915   PT Time Calculation (min) 45 min   Equipment Utilized During Treatment Gait belt   Activity Tolerance Patient tolerated treatment well;No increased pain;Patient limited by fatigue   Behavior During Therapy [facility] for tasks assessed/performed      Past Medical History  Diagnosis Date  . Glaucoma     Past Surgical History  Procedure Laterality Date  . Abdominal hysterectomy      There were no vitals filed for this visit.  Visit Diagnosis:  Unsteady gait  Difficulty walking  Weakness   Neuromuscular Re-education   Marching in place on blue foam pad x 30 seconds for 2 sets   Tandem standing in // bars  x 1 minute Step ups to blue foam pad x 10 bilaterally for 2 sets bilaterally   Heel raises from 2 feet transitioning to TTWB on one foot. X 12 bilaterally for 1 set Sit to stand training x5 repetitions for 3 sets biodex x 5 reps fwd/bwd/side stepping Four square stepping.side to side, front and back and diagonals Feet together on blue foam and holding ball x 1 minute, holding ball and fwd and bwd movement elbow flex/ext x 20, horizontal abd/add with ball and arms extended. balloon toss at parallel bars x 5 minutes  Therapeutic exercise; Leg press x 20 x 3 130 lbs, heel raises with 90 lbs x 20 x 3 Heel raises standing  4 way hip RTB x 20 Patient continues to demonstrates less incoordination of movement with select exercises.. Patient responds well to verbal and tactile  cues to correct form and technique. Motor control of LE much improved.  Muscle fatigue but no major pain complaints.    G codes: Current 40-60 % CK 8978 Goal 20-40 % CJ 8979 Patient is ambulating with SPC outdoors and no device indoors                         PT Education - 01/17/16 0858    Education provided Yes   Education Details HEP)   Person(s) Educated Patient   Methods Explanation   Comprehension Verbalized understanding             PT Long Term Goals - 12/18/15 0849    PT LONG TERM GOAL #1   Status Partially Met   PT LONG TERM GOAL #2   Status Partially Met   PT LONG TERM GOAL #3   Status Partially Met   PT LONG TERM GOAL #5   Status Partially Met               Plan - 01/17/16 0902    Clinical Impression Statement Fatigue with sit to stand but demonstrating more control, Increase weight for standing exercises. Fatigue still evident with cross trainer and endurance   Pt will benefit from skilled therapeutic intervention in order to improve on the following deficits Abnormal gait;Decreased balance;Decreased endurance;Decreased mobility;Decreased strength;Pain   Rehab  Potential Good   PT Frequency 2x / week   PT Duration 12 weeks   PT Treatment/Interventions Neuromuscular re-education;Balance training;Therapeutic exercise;Therapeutic activities;Gait training;Stair training   PT Next Visit Plan Balance training   PT Home Exercise Plan HEP corner balance exercises   Consulted and Agree with Plan of Care Patient        Problem List There are no active problems to display for this patient.  Alanson Puls, PT, DPT [place], Minette Headland S 01/17/2016, 9:05 AM  [place] MAIN [facility] SERVICES [address] [place], [place], [address] Phone: [phone]   Fax:  [phone]  Name: Angie Wilson MRN: [id number] Date of Birth: 11/03/1926

## 2016-01-22 ENCOUNTER — Ambulatory Visit: Payer: Medicare Other | Admitting: Physical Therapy

## 2016-01-22 ENCOUNTER — Encounter: Payer: Self-pay | Admitting: Physical Therapy

## 2016-01-22 DIAGNOSIS — R262 Difficulty in walking, not elsewhere classified: Secondary | ICD-10-CM

## 2016-01-22 DIAGNOSIS — R2681 Unsteadiness on feet: Secondary | ICD-10-CM | POA: Diagnosis not present

## 2016-01-22 DIAGNOSIS — R531 Weakness: Secondary | ICD-10-CM

## 2016-01-22 NOTE — Therapy (Signed)
[place] MAIN [facility] SERVICES [address] [place], [place], [address] Phone: [phone]   Fax:  [phone]  Physical Therapy Treatment  Patient Details  Name: MARIGOLD MOM MRN: [id number] Date of Birth: 1926-07-30 Referring Provider: shah  Encounter Date: 01/22/2016      PT End of Session - 01/22/16 0930    Visit Number 18   Number of Visits 25   Date for PT Re-Evaluation Mar 08, 2016   Authorization Type g codes   PT Start Time 0918   PT Stop Time 1000   PT Time Calculation (min) 42 min   Equipment Utilized During Treatment Gait belt   Activity Tolerance Patient tolerated treatment well;No increased pain;Patient limited by fatigue   Behavior During Therapy [facility] for tasks assessed/performed      Past Medical History  Diagnosis Date  . Glaucoma     Past Surgical History  Procedure Laterality Date  . Abdominal hysterectomy      There were no vitals filed for this visit.  Visit Diagnosis:  Unsteady gait  Difficulty walking  Weakness      Subjective Assessment - 01/22/16 0930    Subjective Patient has been doing well, she is feeling a bit more steady.    Patient is accompained by: Family member      Neuromuscular Re-education   Marching in place on blue foam pad x 30 seconds for 2 sets   Tandem standing in // bars  x 1 minute Step ups to blue foam pad x 10 bilaterally for 2 sets bilaterally    Sit to stand training x5 repetitions for 3 sets biodex x 5 reps fwd/bwd/side stepping Four square stepping.side to side, front and back and diagonals Feet together on blue foam and holding ball x 1 minute, holding ball and fwd and bwd movement elbow flex/ext x 20, horizontal abd/add with ball and arms extended.   Therapeutic exercise; Leg press x 20 x 3 130 lbs, heel raises with 90 lbs x 20 x 3 Heel raises standing  4 way hip RTB x 20 Patient needs occasional verbal cueing to improve posture and cueing to correctly  perform exercises slowly, holding at end of range to increase motor firing of desired muscle to encourage fatigue.                            PT Education - 01/22/16 0930    Education provided Yes   Education Details HEP Review   Person(s) Educated Patient   Methods Explanation   Comprehension Verbalized understanding             PT Long Term Goals - 12/18/15 0849    PT LONG TERM GOAL #1   Status Partially Met   PT LONG TERM GOAL #2   Status Partially Met   PT LONG TERM GOAL #3   Status Partially Met   PT LONG TERM GOAL #5   Status Partially Met               Plan - 01/22/16 0931    Clinical Impression Statement Cueing was needed during the leg press to control motion out/back; frequent instances of decreased eccentric control were exhibited   Pt will benefit from skilled therapeutic intervention in order to improve on the following deficits Abnormal gait;Decreased balance;Decreased endurance;Decreased mobility;Decreased strength;Pain   Rehab Potential Good   PT Frequency 2x / week   PT Duration 12 weeks  PT Treatment/Interventions Neuromuscular re-education;Balance training;Therapeutic exercise;Therapeutic activities;Gait training;Stair training   PT Next Visit Plan Balance training   PT Home Exercise Plan HEP corner balance exercises   Consulted and Agree with Plan of Care Patient        Problem List There are no active problems to display for this patient.  Alanson Puls, PT, DPT [place], Minette Headland S 01/22/2016, 9:34 AM  [place] MAIN [facility] SERVICES [address] [place], [place], [address] Phone: [phone]   Fax:  [phone]  Name: RICKETTA COLANTONIO MRN: [id number] Date of Birth: Apr 24, 1926

## 2016-01-24 ENCOUNTER — Encounter: Payer: Self-pay | Admitting: Physical Therapy

## 2016-01-24 ENCOUNTER — Ambulatory Visit: Payer: Medicare Other | Admitting: Physical Therapy

## 2016-01-24 DIAGNOSIS — R262 Difficulty in walking, not elsewhere classified: Secondary | ICD-10-CM

## 2016-01-24 DIAGNOSIS — R2681 Unsteadiness on feet: Secondary | ICD-10-CM | POA: Diagnosis not present

## 2016-01-24 DIAGNOSIS — R531 Weakness: Secondary | ICD-10-CM

## 2016-01-24 NOTE — Therapy (Signed)
[place] MAIN [facility] SERVICES [address] [place], [place], [address] Phone: [phone]   Fax:  [phone]  Physical Therapy Treatment  Patient Details  Name: Angie Wilson MRN: [id number] Date of Birth: [birth year] Referring Provider: shah  Encounter Date: 01/24/2016      PT End of Session - 01/24/16 0924    Visit Number 19   Number of Visits 25   Date for PT Re-Evaluation February 27, 2016   Authorization Type g codes   PT Start Time 0915   PT Stop Time 1000   PT Time Calculation (min) 45 min   Equipment Utilized During Treatment Gait belt   Activity Tolerance Patient tolerated treatment well;No increased pain;Patient limited by fatigue   Behavior During Therapy [facility] for tasks assessed/performed      Past Medical History  Diagnosis Date  . Glaucoma     Past Surgical History  Procedure Laterality Date  . Abdominal hysterectomy      There were no vitals filed for this visit.  Visit Diagnosis:  Unsteady gait  Difficulty walking  Weakness      Subjective Assessment - 01/24/16 0924    Subjective Patient has been doing well, she is feeling a bit more steady.    Patient is accompained by: Family member   Currently in Pain? No/denies      THER-EX Standing exercises with RTB BLE : Marching 2 x 10; SLR 2 x 10; Abduction 2 x 10; Extension 2 x 10; Knee flexion 2 x 10; Heel raises 2 x 10; Eccentric step downs x 10 BLE Squats x 10 with 5 sec hold Heel raises x 10 x 2  Resisted side-steeping RTB 4 lengths x 2; Sit to stand without UE support 2 x 10; Step-ups to 6" step x 10 bilateral; Quantum leg press 90 lbs x 20  NEUROMUSCULAR RE-EDUCATION Toe tapping 6 inch stool without UE assist Tandem gait in // bars x 4 laps  Side stepping on blue  foam balance beam x 5 lengths of the parallel bars  4 square fwd/bwd, side to side stepping/ diagonal stepping Standing on foam NBOS  sorting balls/ sorting shapes reaching  Patient  continues to demonstrates less incoordination of movement with select exercises such as  stepping backwards. Patient responds well to verbal and tactile cues to correct form and technique. Patient is able to improve in technique with incorrect positions.  Motor control of LE much improved.  Muscle fatigue but no major pain complaints.                            PT Education - 01/24/16 [phone]    Education provided Yes   Education Details HEP   Person(s) Educated Patient   Methods Explanation   Comprehension Verbalized understanding             PT Long Term Goals - 12/18/15 0849    PT LONG TERM GOAL #1   Status Partially Met   PT LONG TERM GOAL #2   Status Partially Met   PT LONG TERM GOAL #3   Status Partially Met   PT LONG TERM GOAL #5   Status Partially Met               Plan - 01/24/16 0925    Clinical Impression Statement Instruction in use of adapted devices. Needs SBA for higher level dynamic standing balance training.    Pt will benefit from skilled  therapeutic intervention in order to improve on the following deficits Abnormal gait;Decreased balance;Decreased endurance;Decreased mobility;Decreased strength;Pain   Rehab Potential Good   PT Frequency 2x / week   PT Duration 12 weeks   PT Treatment/Interventions Neuromuscular re-education;Balance training;Therapeutic exercise;Therapeutic activities;Gait training;Stair training   PT Next Visit Plan Balance training   PT Home Exercise Plan HEP corner balance exercises   Consulted and Agree with Plan of Care Patient        Problem List There are no active problems to display for this patient.  Alanson Puls, PT, DPT [place], Minette Headland S 01/24/2016, 9:29 AM  [place] MAIN [facility] SERVICES [address] [place], [place], [address] Phone: [phone]   Fax:  [phone]  Name: Angie Wilson MRN: [id number] Date of Birth:  Feb 14, 1926

## 2016-01-28 ENCOUNTER — Encounter: Payer: Self-pay | Admitting: Physical Therapy

## 2016-01-28 ENCOUNTER — Ambulatory Visit: Payer: Medicare Other | Admitting: Physical Therapy

## 2016-01-28 DIAGNOSIS — R262 Difficulty in walking, not elsewhere classified: Secondary | ICD-10-CM

## 2016-01-28 DIAGNOSIS — R2681 Unsteadiness on feet: Secondary | ICD-10-CM | POA: Diagnosis not present

## 2016-01-28 DIAGNOSIS — R531 Weakness: Secondary | ICD-10-CM

## 2016-01-28 NOTE — Therapy (Signed)
[place] MAIN [facility] SERVICES [address] [place], [place], [address] Phone: [phone]   Fax:  [phone]  Physical Therapy Treatment  Patient Details  Name: Angie Wilson MRN: [id number] Date of Birth: [birth year] Referring Provider: shah  Encounter Date: 01/28/2016      PT End of Session - 01/28/16 0840    Visit Number 20   Number of Visits 25   Date for PT Re-Evaluation 02-17-16   Authorization Type g codes   PT Start Time 0830   PT Stop Time 0915   PT Time Calculation (min) 45 min   Equipment Utilized During Treatment Gait belt   Activity Tolerance Patient tolerated treatment well;No increased pain;Patient limited by fatigue   Behavior During Therapy [facility] for tasks assessed/performed      Past Medical History  Diagnosis Date  . Glaucoma     Past Surgical History  Procedure Laterality Date  . Abdominal hysterectomy      There were no vitals filed for this visit.  Visit Diagnosis:  Unsteady gait  Difficulty walking  Weakness      Subjective Assessment - 01/28/16 0839    Subjective Patient has been doing well, she is feeling a bit more steady.    Patient is accompained by: Family member     Standing balance training: Side stepping on blue  foam balance beam x 5 lengths of the parallel bars  4 square fwd/bwd, side to side stepping/ diagonal stepping Standing on foam NBOS  sorting balls/ sorting shapes reaching  Matrix side stepping/backwards walking/fwd walking x 5 reps Therapeutic exercise; Leg press x 20 x 3 130 lbs,  heel raises with 90 lbs x 20 x 3 Heel raises standing  4 way hip RTB x 20                           PT Education - 01/28/16 0839    Education provided Yes   Education Details HEP   Person(s) Educated Patient   Methods Explanation   Comprehension Verbalized understanding             PT Long Term Goals - 12/18/15 0849    PT LONG TERM GOAL #1   Status  Partially Met   PT LONG TERM GOAL #2   Status Partially Met   PT LONG TERM GOAL #3   Status Partially Met   PT LONG TERM GOAL #5   Status Partially Met               Plan - 01/28/16 0840    Clinical Impression Statement Fatigue with sit to stand but demonstrating more control, Increase weight for standing exercises. Fatigue still evident with cross trainer and endurance.    Pt will benefit from skilled therapeutic intervention in order to improve on the following deficits Abnormal gait;Decreased balance;Decreased endurance;Decreased mobility;Decreased strength;Pain   Rehab Potential Good   PT Frequency 2x / week   PT Duration 12 weeks   PT Treatment/Interventions Neuromuscular re-education;Balance training;Therapeutic exercise;Therapeutic activities;Gait training;Stair training   PT Next Visit Plan Balance training   PT Home Exercise Plan HEP corner balance exercises   Consulted and Agree with Plan of Care Patient        Problem List There are no active problems to display for this patient.  Alanson Puls, PT, DPT [place], Minette Headland S 01/28/2016, 8:42 AM  [place] MAIN [facility] SERVICES [address]  [place], [place], [address] Phone: [phone]   Fax:  [phone]  Name: Angie Wilson MRN: [id number] Date of Birth: [birth year]

## 2016-01-31 ENCOUNTER — Ambulatory Visit: Payer: Medicare Other | Admitting: Physical Therapy

## 2016-01-31 ENCOUNTER — Encounter: Payer: Self-pay | Admitting: Physical Therapy

## 2016-01-31 DIAGNOSIS — R531 Weakness: Secondary | ICD-10-CM

## 2016-01-31 DIAGNOSIS — R262 Difficulty in walking, not elsewhere classified: Secondary | ICD-10-CM

## 2016-01-31 DIAGNOSIS — R2681 Unsteadiness on feet: Secondary | ICD-10-CM

## 2016-01-31 NOTE — Therapy (Signed)
[place] MAIN [facility] SERVICES [address] [place], [place], [address] Phone: [phone]   Fax:  [phone]  Physical Therapy Treatment  Patient Details  Name: Angie Wilson MRN: [id number] Date of Birth: Jun 23, 1926 Referring Provider: shah  Encounter Date: 01/31/2016      PT End of Session - 01/31/16 0939    Visit Number 21   Number of Visits 25   Date for PT Re-Evaluation February 20, 2016   Authorization Type g codes   PT Start Time 0930   PT Stop Time 1010   PT Time Calculation (min) 40 min   Equipment Utilized During Treatment Gait belt   Activity Tolerance Patient tolerated treatment well;No increased pain;Patient limited by fatigue   Behavior During Therapy [facility] for tasks assessed/performed      Past Medical History  Diagnosis Date  . Glaucoma     Past Surgical History  Procedure Laterality Date  . Abdominal hysterectomy      There were no vitals filed for this visit.  Visit Diagnosis:  Unsteady gait  Difficulty walking  Weakness      Subjective Assessment - 01/31/16 0939    Subjective Patient has been doing well, she is feeling a bit more steady.    Patient is accompained by: Family member   Currently in Pain? No/denies      NEUROMUSCULAR RE-ED Gait in hallway with horizontal and vertical head turns 80' x 2; Standing slow marching without UE support 2 x 10; Toe taps on BOSU x 10 bilateral, alternating; Modified tandem stance eyes open/closed x 30 seconds each alternating LE; Modified tandem stance eyes open with horizontal and vertical head turns alternating Neuromuscular training: Tandem stand with head turns x 2 minutes Side stepping with head control Stepping onto AIREX, then on to 4 inch step followed by stepping down onto AIREX and then level surface in //bars x15.  Pt required occasional UE assist, and performance improved with each repetition   Stepping over and back x10 bilaterally   Stepping over and  back x`10 bilaterally with verbal cueing to perform exercise as fast as possible Side step and back x10 bilaterally Side step and back x10 bilaterallyLeg press:6  plates 3x10 Hamstring curls4  plates 3x10 Knee extension 2 plates 3x10  side steps in // bars with yellow band x 5 laps Patient needs occasional verbal cueing to improve posture and cueing to correctly perform exercises slowly, holding at end of range to increase motor firing of desired muscle to encourage fatigue.                             PT Education - 01/31/16 0939    Education provided Yes   Education Details HEP   Person(s) Educated Patient   Methods Explanation   Comprehension Verbalized understanding             PT Long Term Goals - 12/18/15 0849    PT LONG TERM GOAL #1   Status Partially Met   PT LONG TERM GOAL #2   Status Partially Met   PT LONG TERM GOAL #3   Status Partially Met   PT LONG TERM GOAL #5   Status Partially Met               Plan - 01/31/16 0940    Clinical Impression Statement Pt demonstrated decreased endurance with sit-to-stands exhibiting fatigue towards the end of second set with increased breathing rate  Pt will benefit from skilled therapeutic intervention in order to improve on the following deficits Abnormal gait;Decreased balance;Decreased endurance;Decreased mobility;Decreased strength;Pain   Rehab Potential Good   PT Frequency 2x / week   PT Duration 12 weeks   PT Treatment/Interventions Neuromuscular re-education;Balance training;Therapeutic exercise;Therapeutic activities;Gait training;Stair training   PT Next Visit Plan Balance training   PT Home Exercise Plan HEP corner balance exercises   Consulted and Agree with Plan of Care Patient        Problem List There are no active problems to display for this patient.  Alanson Puls, PT, DPT [place], Minette Headland S 01/31/2016, 9:42 AM  [place]  MAIN [facility] SERVICES [address] [place], [place], [address] Phone: [phone]   Fax:  [phone]  Name: Angie Wilson MRN: [id number] Date of Birth: 1926-10-08

## 2016-02-05 ENCOUNTER — Ambulatory Visit: Payer: Medicare Other | Admitting: Physical Therapy

## 2016-02-05 ENCOUNTER — Encounter: Payer: Self-pay | Admitting: Physical Therapy

## 2016-02-05 DIAGNOSIS — R531 Weakness: Secondary | ICD-10-CM

## 2016-02-05 DIAGNOSIS — R262 Difficulty in walking, not elsewhere classified: Secondary | ICD-10-CM

## 2016-02-05 DIAGNOSIS — R2681 Unsteadiness on feet: Secondary | ICD-10-CM | POA: Diagnosis not present

## 2016-02-05 NOTE — Therapy (Signed)
[place] MAIN [facility] SERVICES [address] [place], [place], [address] Phone: [phone]   Fax:  [phone]  Physical Therapy Treatment  Patient Details  Name: TAQUILLA DOWNUM MRN: [id number] Date of Birth: 1926/09/17 Referring Provider: shah  Encounter Date: 02/05/2016      PT End of Session - 02/05/16 1036    Visit Number 21   Number of Visits 25   Date for PT Re-Evaluation Feb 26, 2016   Authorization Type g codes   PT Start Time 02/12/29   PT Stop Time 1115   PT Time Calculation (min) 45 min   Equipment Utilized During Treatment Gait belt   Activity Tolerance Patient tolerated treatment well;No increased pain;Patient limited by fatigue   Behavior During Therapy [facility] for tasks assessed/performed      Past Medical History  Diagnosis Date  . Glaucoma     Past Surgical History  Procedure Laterality Date  . Abdominal hysterectomy      There were no vitals filed for this visit.  Visit Diagnosis:  Unsteady gait  Difficulty walking  Weakness      Subjective Assessment - 02/05/16 1035    Subjective Patient has been doing well, she is feeling a bit more steady.    Currently in Pain? No/denies      NEUROMUSCULAR RE-EDUCATION Airex NBOS eyes open/closed x 30 seconds each; Airex NBOS eyes open horizontal and vertical head turns x 30 seconds; Airex cone  reaching crossing midline  Toe tapping 6 inch stool without UE assist Tandem gait in // bars x 4 laps  Side stepping on blue  foam balance beam x 5 lengths of the parallel bars  4 square fwd/bwd, side to side stepping/ diagonal stepping Standing on foam NBOS  sorting balls/ sorting shapes reaching  Therapeutic exercise; Leg press x 20 x 3 105 lbs,  heel raises with 100 lbs x 20 x 3 Heel raises standing  4 way hip RTB x 20 Patient needs occasional verbal cueing to improve posture and cueing to correctly perform exercises slowly, holding at end of range to increase motor firing  of desired muscle to encourage fatigue.                             PT Education - 02/05/16 1036    Education provided Yes   Education Details HEP   Person(s) Educated Patient   Methods Explanation   Comprehension Verbalized understanding             PT Long Term Goals - 12/18/15 0849    PT LONG TERM GOAL #1   Status Partially Met   PT LONG TERM GOAL #2   Status Partially Met   PT LONG TERM GOAL #3   Status Partially Met   PT LONG TERM GOAL #5   Status Partially Met               Plan - 02/05/16 1037    Clinical Impression Statement Pt demonstrated decreased endurance with sit-to-stands exhibiting fatigue towards the end of second set with increased breathing rate.    Pt will benefit from skilled therapeutic intervention in order to improve on the following deficits Abnormal gait;Decreased balance;Decreased endurance;Decreased mobility;Decreased strength;Pain   Rehab Potential Good   PT Frequency 2x / week   PT Duration 12 weeks   PT Treatment/Interventions Neuromuscular re-education;Balance training;Therapeutic exercise;Therapeutic activities;Gait training;Stair training   PT Next Visit Plan Balance training  PT Home Exercise Plan HEP corner balance exercises   Consulted and Agree with Plan of Care Patient        Problem List There are no active problems to display for this patient. Alanson Puls, PT, DPT  [place], Minette Headland S 02/05/2016, 10:38 AM  [place] MAIN [facility] SERVICES [address] [place], [place], [address] Phone: [phone]   Fax:  [phone]  Name: VERLENA MARLETTE MRN: [id number] Date of Birth: 1926-05-23

## 2016-02-07 ENCOUNTER — Ambulatory Visit: Payer: Medicare Other | Admitting: Physical Therapy

## 2016-02-07 ENCOUNTER — Encounter: Payer: Self-pay | Admitting: Physical Therapy

## 2016-02-07 DIAGNOSIS — R531 Weakness: Secondary | ICD-10-CM

## 2016-02-07 DIAGNOSIS — R2681 Unsteadiness on feet: Secondary | ICD-10-CM | POA: Diagnosis not present

## 2016-02-07 DIAGNOSIS — R262 Difficulty in walking, not elsewhere classified: Secondary | ICD-10-CM

## 2016-02-07 NOTE — Therapy (Signed)
[place] MAIN [facility] SERVICES [address] [place], [place], [address] Phone: [phone]   Fax:  [phone]  Physical Therapy Treatment  Patient Details  Name: SHERISA GILVIN MRN: [id number] Date of Birth: 1926-07-13 Referring Provider: shah  Encounter Date: 02/07/2016      PT End of Session - 02/07/16 1038    Visit Number 22   Number of Visits 25   Date for PT Re-Evaluation Mar 03, 2016   Authorization Type g codes   Equipment Utilized During Treatment Gait belt   Activity Tolerance Patient tolerated treatment well;No increased pain;Patient limited by fatigue   Behavior During Therapy [facility] for tasks assessed/performed      Past Medical History  Diagnosis Date  . Glaucoma     Past Surgical History  Procedure Laterality Date  . Abdominal hysterectomy      There were no vitals filed for this visit.  Visit Diagnosis:  Unsteady gait  Difficulty walking  Weakness      Subjective Assessment - 02/07/16 1038    Subjective Patient has been doing well, she is feeling a bit more steady.    Patient is accompained by: Family member   Currently in Pain? No/denies      NEUROMUSCULAR RE-EDUCATION Airex NBOS eyes open/closed x 30 seconds each; Airex NBOS eyes open horizontal and vertical head turns x 30 seconds; Airex cone  reaching crossing midline  Toe tapping 6 inch stool without UE assist Tandem gait in // bars x 4 laps  Side stepping on blue  foam balance beam x 5 lengths of the parallel bars  4 square fwd/bwd, side to side stepping/ diagonal stepping Standing on foam NBOS  sorting balls/ sorting shapes reaching  Patient needs occasional verbal cueing to improve posture and cueing to correctly perform exercises slowly, holding at end of range to increase motor firing of desired muscle to encourage fatigue.                            PT Education - 02/07/16 1038    Education provided Yes   Person(s)  Educated Patient   Methods Explanation   Comprehension Verbalized understanding             PT Long Term Goals - 12/18/15 0849    PT LONG TERM GOAL #1   Status Partially Met   PT LONG TERM GOAL #2   Status Partially Met   PT LONG TERM GOAL #3   Status Partially Met   PT LONG TERM GOAL #5   Status Partially Met               Plan - 02/07/16 1039    Clinical Impression Statement Cueing was needed during the leg press to control motion out/back; frequent instances of decreased eccentric control were exhibited. Min cueing needed during hip 3-way to maintain upright posture with knees straight. Pt had good performance of therapeutic exercise today   Pt will benefit from skilled therapeutic intervention in order to improve on the following deficits Abnormal gait;Decreased balance;Decreased endurance;Decreased mobility;Decreased strength;Pain   Rehab Potential Good   PT Frequency 2x / week   PT Duration 12 weeks   PT Treatment/Interventions Neuromuscular re-education;Balance training;Therapeutic exercise;Therapeutic activities;Gait training;Stair training   PT Next Visit Plan Balance training   PT Home Exercise Plan HEP corner balance exercises   Consulted and Agree with Plan of Care Patient        Problem List  There are no active problems to display for this patient. Alanson Puls, PT, DPT  [place], Minette Headland S 02/07/2016, 10:41 AM  [place] MAIN [facility] SERVICES [address] [place], [place], [address] Phone: [phone]   Fax:  [phone]  Name: JAIDYN USERY MRN: [id number] Date of Birth: Jul 18, 1926

## 2016-02-12 ENCOUNTER — Ambulatory Visit: Payer: Medicare Other | Admitting: Physical Therapy

## 2016-02-12 ENCOUNTER — Encounter: Payer: Self-pay | Admitting: Physical Therapy

## 2016-02-12 DIAGNOSIS — R2681 Unsteadiness on feet: Secondary | ICD-10-CM

## 2016-02-12 DIAGNOSIS — R262 Difficulty in walking, not elsewhere classified: Secondary | ICD-10-CM

## 2016-02-12 DIAGNOSIS — R531 Weakness: Secondary | ICD-10-CM

## 2016-02-12 NOTE — Therapy (Signed)
[place] MAIN [facility] SERVICES [address] [place], [place], [address] Phone: [phone]   Fax:  [phone]  Physical Therapy Treatment  Patient Details  Name: Angie Wilson MRN: [id number] Date of Birth: [birth year] Referring Provider: shah  Encounter Date: 02/27/16      PT End of Session - 02-27-2016 1058    Visit Number 23   Date for PT Re-Evaluation February 27, 2016   Authorization Type g codes   PT Start Time 1029/02/24   PT Stop Time 1115   PT Time Calculation (min) 45 min   Equipment Utilized During Treatment Gait belt   Activity Tolerance Patient tolerated treatment well;Patient limited by fatigue;No increased pain      Past Medical History  Diagnosis Date  . Glaucoma     Past Surgical History  Procedure Laterality Date  . Abdominal hysterectomy      There were no vitals filed for this visit.  Visit Diagnosis:  Unsteady gait  Difficulty walking  Weakness      Subjective Assessment - February 27, 2016 1058    Subjective Patient has been doing well, she is feeling a bit more steady.    Patient is accompained by: Family member      Toe taps on AIREX + step 3x10 no Ue Fwd step up onto 6inch step from AIREX no Ue x 10  Fwd step up onto 4inch step no Ue x 10 each side CGA to min A needed cues for wt  THER-EX Nustep L3 x 5 minutes for warm-up during history (3 minutes unbilled); Quantum double leg press 105# x 10, 120# x 10, 135# x 10; Heel raises with UE support and toes on 2x4, 2 x 10; Mini squats with RTB around knees to prevent valgus 2 x 10; RTB side stepping in // bars 4 lengths x 2; Sit to stand without UE support x 10 Patient needs moderate verbal cueing to improve posture and cueing to correctly perform exercises slowly, holding at end of range to increase motor firing of desired muscle to encourage fatigue.                            PT Education - 2016-02-27 1058    Education provided Yes   Education Details HEP   Person(s) Educated Patient   Methods Explanation   Comprehension Verbalized understanding             PT Long Term Goals - 12/18/15 0849    PT LONG TERM GOAL #1   Status Partially Met   PT LONG TERM GOAL #2   Status Partially Met   PT LONG TERM GOAL #3   Status Partially Met   PT LONG TERM GOAL #5   Status Partially Met               Plan - February 27, 2016 1059    Clinical Impression Statement PT provided  moderate verbal instruction to improve set up, proper use of LE, and improved posture and gait mechanics. Patient responded moderately to instruction   Pt will benefit from skilled therapeutic intervention in order to improve on the following deficits Abnormal gait;Decreased balance;Decreased endurance;Decreased mobility;Decreased strength;Pain   Rehab Potential Good   PT Frequency 2x / week   PT Duration 12 weeks   PT Treatment/Interventions Neuromuscular re-education;Balance training;Therapeutic exercise;Therapeutic activities;Gait training;Stair training   PT Next Visit Plan Balance training   PT Home Exercise Plan HEP corner balance exercises  Consulted and Agree with Plan of Care Patient        Problem List There are no active problems to display for this patient.  Alanson Puls, PT, DPT [place], Minette Headland S 02/12/2016, 11:06 AM  [place] MAIN [facility] SERVICES [address] [place], [place], [address] Phone: [phone]   Fax:  [phone]  Name: Angie Wilson MRN: [id number] Date of Birth: [birth year]

## 2016-02-14 ENCOUNTER — Encounter: Payer: Self-pay | Admitting: Physical Therapy

## 2016-02-14 ENCOUNTER — Ambulatory Visit: Payer: Medicare Other | Attending: Neurology | Admitting: Physical Therapy

## 2016-02-14 DIAGNOSIS — R2681 Unsteadiness on feet: Secondary | ICD-10-CM | POA: Diagnosis not present

## 2016-02-14 DIAGNOSIS — R531 Weakness: Secondary | ICD-10-CM

## 2016-02-14 DIAGNOSIS — R262 Difficulty in walking, not elsewhere classified: Secondary | ICD-10-CM | POA: Diagnosis present

## 2016-02-14 NOTE — Therapy (Signed)
[place] MAIN [facility] SERVICES [address] [place], [place], [address] Phone: [phone]   Fax:  [phone]  Physical Therapy Treatment  Patient Details  Name: Angie Wilson MRN: [id number] Date of Birth: 04-23-1926 Referring Provider: shah  Encounter Date: 02/14/2016      PT End of Session - 02/14/16 1033    Visit Number 24   Date for PT Re-Evaluation Feb 24, 2016   Authorization Type g codes   PT Start Time 1025   PT Stop Time 1105   PT Time Calculation (min) 40 min   Equipment Utilized During Treatment Gait belt   Activity Tolerance Patient tolerated treatment well;Patient limited by fatigue;No increased pain      Past Medical History  Diagnosis Date  . Glaucoma     Past Surgical History  Procedure Laterality Date  . Abdominal hysterectomy      There were no vitals filed for this visit.  Visit Diagnosis:  Unsteady gait  Difficulty walking  Weakness      Subjective Assessment - 02/14/16 1032    Subjective Patient has been doing well, she is feeling a bit more steady.    Patient is accompained by: Family member   Currently in Pain? No/denies        NEUROMUSCULAR RE-EDUCATION Airex NBOS eyes open/closed x 30 seconds each; Airex NBOS eyes open horizontal and vertical head turns x 30 seconds; Airex cone taps alternating LE , sorting balls, sorting shapes tandem and feet together standing hip abd with YTB x 20  side stepping left and right in parallel bars 10 feet x 3 standing on blue foam with cone reaching x 20 across midline step ups from floor to 6 inch stool x 20 bilateral sit to stand x 10 marching in parallel bars x 20 stepping pattern with weight shifting fwd/bwd x 10.   Min  verbal cues used throughout with increased in postural sway and LOB most seen with narrow base of support and while on uneven surfaces. Continues to have balance deficits typical with diagnosis. Patient performs intermediate level  exercises without pain behaviors and needs verbal cuing for postural alignment.                           PT Education - 02/14/16 1033    Education provided Yes   Education Details HEP   Person(s) Educated Patient   Methods Explanation   Comprehension Verbalized understanding             PT Long Term Goals - 12/18/15 0849    PT LONG TERM GOAL #1   Status Partially Met   PT LONG TERM GOAL #2   Status Partially Met   PT LONG TERM GOAL #3   Status Partially Met   PT LONG TERM GOAL #5   Status Partially Met               Plan - 02/14/16 1034    Clinical Impression Statement Muscle fatigue but no major pain complaints. Patient advancing to red theraband for exercises listed above.   Pt will benefit from skilled therapeutic intervention in order to improve on the following deficits Abnormal gait;Decreased balance;Decreased endurance;Decreased mobility;Decreased strength;Pain   Rehab Potential Good   PT Frequency 2x / week   PT Duration 12 weeks   PT Treatment/Interventions Neuromuscular re-education;Balance training;Therapeutic exercise;Therapeutic activities;Gait training;Stair training   PT Next Visit Plan Balance training   PT Home Exercise Plan HEP  corner balance exercises   Consulted and Agree with Plan of Care Patient        Problem List There are no active problems to display for this patient.  Alanson Puls, PT, DPT [place], Minette Headland S 02/14/2016, 10:36 AM  [place] MAIN [facility] SERVICES [address] [place], [place], [address] Phone: [phone]   Fax:  [phone]  Name: Angie Wilson MRN: [id number] Date of Birth: 05/19/1926

## 2016-02-19 ENCOUNTER — Ambulatory Visit: Payer: Medicare Other | Admitting: Physical Therapy

## 2016-02-19 ENCOUNTER — Encounter: Payer: Self-pay | Admitting: Physical Therapy

## 2016-02-19 DIAGNOSIS — R2681 Unsteadiness on feet: Secondary | ICD-10-CM | POA: Diagnosis not present

## 2016-02-19 DIAGNOSIS — R531 Weakness: Secondary | ICD-10-CM

## 2016-02-19 DIAGNOSIS — R262 Difficulty in walking, not elsewhere classified: Secondary | ICD-10-CM

## 2016-02-19 NOTE — Therapy (Addendum)
[place] MAIN [facility] SERVICES [address] [place], [place], [address] Phone: [phone]   Fax:  [phone]  Physical Therapy Treatment  Patient Details  Name: Angie Wilson MRN: [id number] Date of Birth: 1926-10-10 Referring Provider: shah  Encounter Date: 03-15-16      PT End of Session - 2016-03-15 1037    Visit Number 25   Date for PT Re-Evaluation 2016-03-08   Authorization Type g codes   PT Start Time 03-06-29   PT Stop Time 1115   PT Time Calculation (min) 45 min   Equipment Utilized During Treatment Gait belt   Activity Tolerance Patient tolerated treatment well;Patient limited by fatigue;No increased pain      Past Medical History  Diagnosis Date  . Glaucoma     Past Surgical History  Procedure Laterality Date  . Abdominal hysterectomy      There were no vitals filed for this visit.  Visit Diagnosis:  Unsteady gait  Difficulty walking  Weakness      Subjective Assessment - 03-15-2016 1036    Subjective Patient has been doing well, she is feeling a bit more steady.    Patient is accompained by: Family member   Currently in Pain? No/denies     Therapeutic exercise; standing hip abd with YTB x 20  side stepping left and right in parallel bars 10 feet x 3 standing on blue foam with cone reaching x 20 across midline step ups from floor to 6 inch stool x 20 bilateral sit to stand x 10 marching in parallel bars x 20 TM walking side stepping . 5 miles/hr left and right  Outcome measures: Tug 11 sec 6 MW greater than 1000 feet Patient has partially met all goals Patient needs occasional verbal cueing to improve posture and cueing to correctly perform exercises slowly, holding at end of range to increase motor firing of desired muscle to encourage fatigue.            G-Codes - 2016-03-15 March 06, 1211    Functional Assessment Tool Used 5 x sit to stand, TUG, 10 MW, 6 MW   Mobility: Walking and Moving Around Current  Status [phone]) At least 20 percent but less than 40 percent impaired, limited or restricted   Mobility: Walking and Moving Around Goal Status [phone]) At least 1 percent but less than 20 percent impaired, limited or restricted                            PT Education - 03/15/16 1037    Education provided Yes   Education Details HEP   Person(s) Educated Patient   Methods Explanation   Comprehension Verbalized understanding             PT Long Term Goals - 2016-03-15 1648    PT LONG TERM GOAL #1   Title Patient will increase BLE gross strength to 4+/5 as to improve functional strength for independent gait, increased standing tolerance and increased ADL ability   Time 12   Period Weeks   Status Partially Met   PT LONG TERM GOAL #2   Title Patient will increase BLE gross strength to 4+/5 as to improve functional strength for independent gait, increased standing tolerance and increased ADL ability   Time 12   Period Weeks   Status Partially Met   PT LONG TERM GOAL #3   Title Patient will tolerate 5 seconds of single leg stance without loss  of balance to improve ability to get in and out of shower safely   Time 12   Period Weeks   Status Partially Met   PT LONG TERM GOAL #4   Title Patient will reduce timed up and go to <11 seconds to reduce fall risk and demonstrate improved transfer/gait ability   Time 12   Period Weeks   Status Achieved   PT LONG TERM GOAL #5   Title Patient will increase six minute walk test distance to >1000 for progression to community ambulator and improve gait ability   Time 12   Period Weeks   Status Achieved               Plan - 02/19/16 1037    Clinical Impression Statement Patient has fatigue with standing exercises and needs constant VC to have correct posture   Pt will benefit from skilled therapeutic intervention in order to improve on the following deficits Abnormal gait;Decreased balance;Decreased endurance;Decreased  mobility;Decreased strength;Pain   Rehab Potential Good   PT Frequency 2x / week   PT Duration 12 weeks   PT Treatment/Interventions Neuromuscular re-education;Balance training;Therapeutic exercise;Therapeutic activities;Gait training;Stair training   PT Next Visit Plan Balance training   PT Home Exercise Plan HEP corner balance exercises   Consulted and Agree with Plan of Care Patient        Problem List There are no active problems to display for this patient.  Alanson Puls, PT, DPT [place], Minette Headland S 02/19/2016, 4:52 PM  [place] MAIN [facility] SERVICES [address] [place], [place], [address] Phone: [phone]   Fax:  [phone]  Name: TAKIRAH BINFORD MRN: [id number] Date of Birth: [birth year]

## 2016-02-19 NOTE — Addendum Note (Signed)
Addended by: [name] on: 02/19/2016 04:54 PM   Modules accepted: Orders

## 2016-02-21 ENCOUNTER — Ambulatory Visit: Payer: Medicare Other | Admitting: Physical Therapy

## 2016-02-26 ENCOUNTER — Encounter: Payer: Self-pay | Admitting: Physical Therapy

## 2016-02-26 ENCOUNTER — Ambulatory Visit: Payer: Medicare Other | Admitting: Physical Therapy

## 2016-02-26 DIAGNOSIS — R262 Difficulty in walking, not elsewhere classified: Secondary | ICD-10-CM

## 2016-02-26 DIAGNOSIS — R2681 Unsteadiness on feet: Secondary | ICD-10-CM | POA: Diagnosis not present

## 2016-02-26 DIAGNOSIS — R531 Weakness: Secondary | ICD-10-CM

## 2016-02-26 NOTE — Therapy (Signed)
[place] MAIN [facility] SERVICES [address] [place], [place], [address] Phone: [phone]   Fax:  [phone]  Physical Therapy Treatment  Patient Details  Name: NIDA MANFREDI MRN: [id number] Date of Birth: 1926/08/07 Referring Provider: shah  Encounter Date: 02/26/2016      PT End of Session - 02/26/16 1043    Visit Number 26   Date for PT Re-Evaluation 2016-02-20   Authorization Type g codes   PT Start Time Feb 17, 1029   PT Stop Time 1115   PT Time Calculation (min) 45 min   Equipment Utilized During Treatment Gait belt   Activity Tolerance Patient tolerated treatment well;Patient limited by fatigue;No increased pain      Past Medical History  Diagnosis Date  . Glaucoma     Past Surgical History  Procedure Laterality Date  . Abdominal hysterectomy      There were no vitals filed for this visit.  Visit Diagnosis:  Unsteady gait  Difficulty walking  Weakness      Subjective Assessment - 02/26/16 1036    Subjective Patient has been doing well, she is feeling a bit more steady.    Patient is accompained by: Family member   Currently in Pain? No/denies      Therapeutic exercise:  Sit to stand 10 x 3 sets with sitting on pillow to increase height   Heel raises 10 x 3 sets Standing hip abduction, flexion and extension in // bars 10 x 2 sets bilaterally Leg press x 20 x 3 90 lbs  Pt required min to mod cues for correct exercise sequencing with verbal and tactile cues and CGA for safety  Neuromuscular re ed:  NBOS feet together on airex with eyes close 15 sec x 5 sets NBOS feet together on airex with lateral and up and down head movement 30 sec x 3 sets Tandem stance on airex 20 sec x 2 sets 4 square diagonals and side stepping x 10  Pt required min to mod assistance for safety with min verbal cues for correct form                            PT Education - 02/26/16 1036    Education provided Yes    Education Details HEP   Person(s) Educated Patient   Methods Explanation   Comprehension Verbalized understanding             PT Long Term Goals - 02/19/16 1648    PT LONG TERM GOAL #1   Title Patient will increase BLE gross strength to 4+/5 as to improve functional strength for independent gait, increased standing tolerance and increased ADL ability   Time 12   Period Weeks   Status Partially Met   PT LONG TERM GOAL #2   Title Patient will increase BLE gross strength to 4+/5 as to improve functional strength for independent gait, increased standing tolerance and increased ADL ability   Time 12   Period Weeks   Status Partially Met   PT LONG TERM GOAL #3   Title Patient will tolerate 5 seconds of single leg stance without loss of balance to improve ability to get in and out of shower safely   Time 12   Period Weeks   Status Partially Met   PT LONG TERM GOAL #4   Title Patient will reduce timed up and go to <11 seconds to reduce fall risk and demonstrate improved transfer/gait  ability   Time 12   Period Weeks   Status Achieved   PT LONG TERM GOAL #5   Title Patient will increase six minute walk test distance to >1000 for progression to community ambulator and improve gait ability   Time 12   Period Weeks   Status Achieved               Plan - 02/26/16 1044    Clinical Impression Statement  Patient tolerates exercises without a rest period and has no reports of pain   Pt will benefit from skilled therapeutic intervention in order to improve on the following deficits Abnormal gait;Decreased balance;Decreased endurance;Decreased mobility;Decreased strength;Pain   Rehab Potential Good   PT Frequency 2x / week   PT Duration 12 weeks   PT Treatment/Interventions Neuromuscular re-education;Balance training;Therapeutic exercise;Therapeutic activities;Gait training;Stair training   PT Next Visit Plan Balance training   PT Home Exercise Plan HEP corner balance  exercises   Consulted and Agree with Plan of Care Patient        Problem List There are no active problems to display for this patient. Alanson Puls, PT, DPT  [place], Minette Headland S 02/26/2016, 10:55 AM  [place] MAIN [facility] SERVICES [address] [place], [place], [address] Phone: [phone]   Fax:  [phone]  Name: GENNA CASIMIR MRN: [id number] Date of Birth: 13-Aug-1926

## 2016-02-28 ENCOUNTER — Encounter: Payer: Self-pay | Admitting: Physical Therapy

## 2016-02-28 ENCOUNTER — Ambulatory Visit: Payer: Medicare Other | Admitting: Physical Therapy

## 2016-02-28 DIAGNOSIS — R262 Difficulty in walking, not elsewhere classified: Secondary | ICD-10-CM

## 2016-02-28 DIAGNOSIS — R531 Weakness: Secondary | ICD-10-CM

## 2016-02-28 DIAGNOSIS — R2681 Unsteadiness on feet: Secondary | ICD-10-CM

## 2016-02-28 NOTE — Therapy (Signed)
[place] MAIN [facility] SERVICES [address] [place], [place], [address] Phone: [phone]   Fax:  [phone]  Physical Therapy Treatment  Patient Details  Name: Angie Wilson MRN: [id number] Date of Birth: 07-29-1926 Referring Provider: shah  Encounter Date: 02/28/2016      PT End of Session - 02/28/16 1039    Visit Number 27   Date for PT Re-Evaluation Feb 15, 2016   Authorization Type g codes   PT Start Time February 03, 1029   PT Stop Time 1115   PT Time Calculation (min) 45 min   Equipment Utilized During Treatment Gait belt   Activity Tolerance Patient tolerated treatment well;Patient limited by fatigue;No increased pain      Past Medical History  Diagnosis Date  . Glaucoma     Past Surgical History  Procedure Laterality Date  . Abdominal hysterectomy      There were no vitals filed for this visit.  Visit Diagnosis:  Unsteady gait  Difficulty walking  Weakness      Subjective Assessment - 02/28/16 1036    Subjective Patient has been doing well, she is feeling a bit more steady.    Patient is accompained by: Family member   Currently in Pain? Yes   Pain Score 2    Pain Location Hip      THER-EX Standing exercises with 2# ankle weights: Marching 2 x 10; SLR 2 x 10; Abduction 2 x 10; Extension 2 x 10; Knee flexion 2 x 10; Heel raises 2 x 10;  Resisted side-steeping RTB 4 lengths x 2; Standing mini squats 2 x 10 with RTB around knees to encourage abduction; Sit to stand without UE support 2 x 10; Step-ups to 6" step x 10 bilateral; Quantum leg press 100 lbs x 15 x 3  NEUROMUSCULAR RE-EDUCATION Airex NBOS eyes open/closed x 30 seconds each; Airex NBOS eyes open horizontal and vertical head turns x 30 seconds; Airex cone taps alternating LE x 60 seconds; Tandem gait in // bars x 4 laps  Patient needs occasional verbal cueing to improve posture and cueing to correctly perform exercises slowly, holding at end of range  to increase motor firing of desired muscle to encourage fatigue.                            PT Education - 02/28/16 1036    Education provided Yes   Education Details HEP   Person(s) Educated Patient   Methods Explanation   Comprehension Verbalized understanding             PT Long Term Goals - 02/19/16 1648    PT LONG TERM GOAL #1   Title Patient will increase BLE gross strength to 4+/5 as to improve functional strength for independent gait, increased standing tolerance and increased ADL ability   Time 12   Period Weeks   Status Partially Met   PT LONG TERM GOAL #2   Title Patient will increase BLE gross strength to 4+/5 as to improve functional strength for independent gait, increased standing tolerance and increased ADL ability   Time 12   Period Weeks   Status Partially Met   PT LONG TERM GOAL #3   Title Patient will tolerate 5 seconds of single leg stance without loss of balance to improve ability to get in and out of shower safely   Time 12   Period Weeks   Status Partially Met   PT LONG  TERM GOAL #4   Title Patient will reduce timed up and go to <11 seconds to reduce fall risk and demonstrate improved transfer/gait ability   Time 12   Period Weeks   Status Achieved   PT LONG TERM GOAL #5   Title Patient will increase six minute walk test distance to >1000 for progression to community ambulator and improve gait ability   Time 12   Period Weeks   Status Achieved               Plan - 02/28/16 1039    Clinical Impression Statement Fatigue with sit to stand but demonstrating more control, Increase weight for standing exercises. Fatigue still evident with cross trainer and endurance.    Pt will benefit from skilled therapeutic intervention in order to improve on the following deficits Abnormal gait;Decreased balance;Decreased endurance;Decreased mobility;Decreased strength;Pain   Rehab Potential Good   PT Frequency 2x / week   PT  Duration 12 weeks   PT Treatment/Interventions Neuromuscular re-education;Balance training;Therapeutic exercise;Therapeutic activities;Gait training;Stair training   PT Next Visit Plan Balance training   PT Home Exercise Plan HEP corner balance exercises   Consulted and Agree with Plan of Care Patient        Problem List There are no active problems to display for this patient.  Alanson Puls, PT, DPT [place], Minette Headland S 02/28/2016, 10:41 AM  [place] MAIN [facility] SERVICES [address] [place], [place], [address] Phone: [phone]   Fax:  [phone]  Name: Angie Wilson MRN: [id number] Date of Birth: 12/28/1925

## 2016-03-04 ENCOUNTER — Encounter: Payer: Self-pay | Admitting: Physical Therapy

## 2016-03-04 ENCOUNTER — Ambulatory Visit: Payer: Medicare Other | Admitting: Physical Therapy

## 2016-03-04 DIAGNOSIS — R262 Difficulty in walking, not elsewhere classified: Secondary | ICD-10-CM

## 2016-03-04 DIAGNOSIS — R531 Weakness: Secondary | ICD-10-CM

## 2016-03-04 DIAGNOSIS — R2681 Unsteadiness on feet: Secondary | ICD-10-CM

## 2016-03-04 NOTE — Therapy (Signed)
[place] MAIN [facility] SERVICES [address] [place], [place], [address] Phone: [phone]   Fax:  [phone]  Physical Therapy Treatment  Patient Details  Name: LINELL MELDRUM MRN: [id number] Date of Birth: [birth year] Referring Provider: shah  Encounter Date: 03/04/2016      PT End of Session - 03/04/16 1037    Visit Number 28   Date for PT Re-Evaluation 02/27/2016   Authorization Type g codes   PT Start Time 02-24-1029   PT Stop Time 1115   PT Time Calculation (min) 45 min   Equipment Utilized During Treatment Gait belt   Activity Tolerance Patient tolerated treatment well;Patient limited by fatigue;No increased pain      Past Medical History  Diagnosis Date  . Glaucoma     Past Surgical History  Procedure Laterality Date  . Abdominal hysterectomy      There were no vitals filed for this visit.  Visit Diagnosis:  Unsteady gait  Difficulty walking  Weakness      Subjective Assessment - 03/04/16 1037    Subjective Patient has been doing well, she is feeling a bit more steady.    Patient is accompained by: Family member   Currently in Pain? No/denies      THER-EX Standing exercises with 2# ankle weights: Marching 2 x 10; SLR 2 x 10; Abduction 2 x 10; Extension 2 x 10; Knee flexion 2 x 10; Heel raises 2 x 10;  Resisted side-steeping RTB 4 lengths x 2; Standing mini squats 2 x 10 with RTB around knees to encourage abduction; Sit to stand without UE support 2 x 10; Step-ups to 6" step x 10 bilateral; Quantum leg press 105# x 10, 120# x 10; Min  verbal cues used throughout with increased in postural sway and LOB most seen with narrow base of support and while on uneven surfaces. Continues to have balance deficits typical with diagnosis. Patient performs intermediate level exercises without pain behaviors and needs verbal cuing for postural alignment and head positioning Tactile cues and assistance needed to keep lower leg  and knee in neutral to avoid compensations with ankle motions.                            PT Education - 03/04/16 1037    Education provided Yes   Education Details HEP   Person(s) Educated Patient   Methods Explanation   Comprehension Verbalized understanding             PT Long Term Goals - 02/19/16 1648    PT LONG TERM GOAL #1   Title Patient will increase BLE gross strength to 4+/5 as to improve functional strength for independent gait, increased standing tolerance and increased ADL ability   Time 12   Period Weeks   Status Partially Met   PT LONG TERM GOAL #2   Title Patient will increase BLE gross strength to 4+/5 as to improve functional strength for independent gait, increased standing tolerance and increased ADL ability   Time 12   Period Weeks   Status Partially Met   PT LONG TERM GOAL #3   Title Patient will tolerate 5 seconds of single leg stance without loss of balance to improve ability to get in and out of shower safely   Time 12   Period Weeks   Status Partially Met   PT LONG TERM GOAL #4   Title Patient will reduce timed up  and go to <11 seconds to reduce fall risk and demonstrate improved transfer/gait ability   Time 12   Period Weeks   Status Achieved   PT LONG TERM GOAL #5   Title Patient will increase six minute walk test distance to >1000 for progression to community ambulator and improve gait ability   Time 12   Period Weeks   Status Achieved               Plan - 03/04/16 1038    Clinical Impression Statement Tactile cues and assistance needed to keep lower leg and knee in neutral to avoid compensations with ankle motions.   Pt will benefit from skilled therapeutic intervention in order to improve on the following deficits Abnormal gait;Decreased balance;Decreased endurance;Decreased mobility;Decreased strength;Pain   Rehab Potential Good   PT Frequency 2x / week   PT Duration 12 weeks   PT  Treatment/Interventions Neuromuscular re-education;Balance training;Therapeutic exercise;Therapeutic activities;Gait training;Stair training   PT Next Visit Plan Balance training   PT Home Exercise Plan HEP corner balance exercises   Consulted and Agree with Plan of Care Patient        Problem List There are no active problems to display for this patient.  Alanson Puls, PT, DPT [place], Minette Headland S 03/04/2016, 10:40 AM  [place] MAIN [facility] SERVICES [address] [place], [place], [address] Phone: [phone]   Fax:  [phone]  Name: LYNDI HOLBEIN MRN: [id number] Date of Birth: 24-Jul-1926

## 2016-03-06 ENCOUNTER — Ambulatory Visit: Payer: Medicare Other | Admitting: Physical Therapy

## 2016-03-06 ENCOUNTER — Encounter: Payer: Self-pay | Admitting: Physical Therapy

## 2016-03-06 DIAGNOSIS — R262 Difficulty in walking, not elsewhere classified: Secondary | ICD-10-CM

## 2016-03-06 DIAGNOSIS — R2681 Unsteadiness on feet: Secondary | ICD-10-CM | POA: Diagnosis not present

## 2016-03-06 DIAGNOSIS — R531 Weakness: Secondary | ICD-10-CM

## 2016-03-06 NOTE — Therapy (Signed)
[place] MAIN [facility] SERVICES [address] [place], [place], [address] Phone: [phone]   Fax:  [phone]  Physical Therapy Treatment  Patient Details  Name: Angie Wilson MRN: [id number] Date of Birth: March 09, 1926 Referring Provider: shah  Encounter Date: 03/06/2016      PT End of Session - 03/06/16 1028    Visit Number 29   Date for PT Re-Evaluation 2016/02/28   Authorization Type g codes   PT Start Time 1024/02/06   PT Stop Time 1110   PT Time Calculation (min) 45 min   Equipment Utilized During Treatment Gait belt   Activity Tolerance Patient tolerated treatment well;Patient limited by fatigue;No increased pain      Past Medical History  Diagnosis Date  . Glaucoma     Past Surgical History  Procedure Laterality Date  . Abdominal hysterectomy      There were no vitals filed for this visit.  Visit Diagnosis:  Unsteady gait  Difficulty walking  Weakness      Subjective Assessment - 03/06/16 1028    Subjective Patient has been doing well, she is feeling a bit more steady.    Patient is accompained by: Family member   Currently in Pain? No/denies      NEUROMUSCULAR RE-EDUCATION Airex NBOS eyes open/closed x 30 seconds each; Airex NBOS eyes open horizontal and vertical head turns x 30 seconds; Airex cone taps alternating LE x 60 seconds; Tandem gait in // bars x 4 laps  THER-EX Standing exercises with 2# ankle weights: Marching 2 x 10; SLR 2 x 10; Abduction 2 x 10; Extension 2 x 10; Heel raises 2 x 10;  Resisted side-steeping RTB 4 lengths x 2; Standing mini squats 2 x 10 with RTB around knees to encourage abduction; Sit to stand without UE support 2 x 10; Step-ups to 6" step x 10 bilateral; Quantum leg press 100 x 20 Min cueing needed to appropriately perform  tasks with leg, hand, and head position. Patient continues to demonstrate some in coordination of movement with select exercises.. Patient responds  well to verbal and tactile cues to correct form and technique.  CGA to SBA for safety with activities.  Uses to increase intensity and amplitude of movements throughout session                           PT Education - 03/06/16 1028    Education provided Yes   Education Details HEP   Person(s) Educated Patient   Methods Explanation   Comprehension Verbalized understanding             PT Long Term Goals - 02/19/16 1648    PT LONG TERM GOAL #1   Title Patient will increase BLE gross strength to 4+/5 as to improve functional strength for independent gait, increased standing tolerance and increased ADL ability   Time 12   Period Weeks   Status Partially Met   PT LONG TERM GOAL #2   Title Patient will increase BLE gross strength to 4+/5 as to improve functional strength for independent gait, increased standing tolerance and increased ADL ability   Time 12   Period Weeks   Status Partially Met   PT LONG TERM GOAL #3   Title Patient will tolerate 5 seconds of single leg stance without loss of balance to improve ability to get in and out of shower safely   Time 12   Period Weeks  Status Partially Met   PT LONG TERM GOAL #4   Title Patient will reduce timed up and go to <11 seconds to reduce fall risk and demonstrate improved transfer/gait ability   Time 12   Period Weeks   Status Achieved   PT LONG TERM GOAL #5   Title Patient will increase six minute walk test distance to >1000 for progression to community ambulator and improve gait ability   Time 12   Period Weeks   Status Achieved               Plan - 03/06/16 1029    Clinical Impression Statement  Pt reports discomfort during but overall less joint stiffness and pain following   Pt will benefit from skilled therapeutic intervention in order to improve on the following deficits Abnormal gait;Decreased balance;Decreased endurance;Decreased mobility;Decreased strength;Pain   Rehab Potential Good    PT Frequency 2x / week   PT Duration 12 weeks   PT Treatment/Interventions Neuromuscular re-education;Balance training;Therapeutic exercise;Therapeutic activities;Gait training;Stair training   PT Next Visit Plan Balance training   PT Home Exercise Plan HEP corner balance exercises   Consulted and Agree with Plan of Care Patient        Problem List There are no active problems to display for this patient.  Alanson Puls, PT, DPT [place], Minette Headland S 03/06/2016, 10:32 AM  [place] MAIN [facility] SERVICES [address] [place], [place], [address] Phone: [phone]   Fax:  [phone]  Name: Angie Wilson MRN: [id number] Date of Birth: 06/24/1926

## 2016-03-11 ENCOUNTER — Ambulatory Visit: Payer: Medicare Other | Admitting: Physical Therapy

## 2016-03-11 ENCOUNTER — Encounter: Payer: Self-pay | Admitting: Physical Therapy

## 2016-03-11 DIAGNOSIS — R531 Weakness: Secondary | ICD-10-CM

## 2016-03-11 DIAGNOSIS — R262 Difficulty in walking, not elsewhere classified: Secondary | ICD-10-CM

## 2016-03-11 DIAGNOSIS — R2681 Unsteadiness on feet: Secondary | ICD-10-CM

## 2016-03-11 NOTE — Therapy (Signed)
[place] MAIN [facility] SERVICES [address] [place], [place], [address] Phone: [phone]   Fax:  [phone]  Physical Therapy Treatment  Patient Details  Name: Angie Wilson MRN: [id number] Date of Birth: 11-21-1926 Referring Provider: shah  Encounter Date: 2016/03/18      PT End of Session - March 18, 2016 1036    Visit Number 30   Number of Visits 25   Date for PT Re-Evaluation March 18, 2016   Authorization Type g codes   PT Start Time 02/16/1029   PT Stop Time 1115   PT Time Calculation (min) 45 min   Equipment Utilized During Treatment Gait belt   Activity Tolerance Patient tolerated treatment well;Patient limited by fatigue;No increased pain      Past Medical History  Diagnosis Date  . Glaucoma     Past Surgical History  Procedure Laterality Date  . Abdominal hysterectomy      There were no vitals filed for this visit.  Visit Diagnosis:  Unsteady gait  Difficulty walking  Weakness      Subjective Assessment - 03-18-16 1036    Subjective Patient has been doing well, she is feeling a bit more steady.    Patient is accompained by: Family member   Currently in Pain? No/denies      standing hip abd with YTB x 20  side stepping left and right in parallel bars 10 feet x 3 standing on blue foam with cone reaching x 20 across midline step ups from floor to 6 inch stool x 20 bilateral sit to stand x 10 marching in parallel bars x 20 therex: in fitness center: Leg press:6  plates 3x10 Hamstring curls4  plates 3x10 Knee extension 2 plates 3x10  side steps in // bars with yellow band x 5 laps Patient needs occasional verbal cueing to improve posture and cueing to correctly perform exercises slowly. Patient and therapist  had  discussion about these balance clinical issues and went over the various important aspects to consider and her questions were answered.                           PT Education -  Mar 18, 2016 1036    Education provided Yes   Education Details HEP   Person(s) Educated Patient   Comprehension Verbalized understanding             PT Long Term Goals - 02/19/16 1648    PT LONG TERM GOAL #1   Title Patient will increase BLE gross strength to 4+/5 as to improve functional strength for independent gait, increased standing tolerance and increased ADL ability   Time 12   Period Weeks   Status Partially Met   PT LONG TERM GOAL #2   Title Patient will increase BLE gross strength to 4+/5 as to improve functional strength for independent gait, increased standing tolerance and increased ADL ability   Time 12   Period Weeks   Status Partially Met   PT LONG TERM GOAL #3   Title Patient will tolerate 5 seconds of single leg stance without loss of balance to improve ability to get in and out of shower safely   Time 12   Period Weeks   Status Partially Met   PT LONG TERM GOAL #4   Title Patient will reduce timed up and go to <11 seconds to reduce fall risk and demonstrate improved transfer/gait ability   Time 12   Period Weeks  Status Achieved   PT LONG TERM GOAL #5   Title Patient will increase six minute walk test distance to >1000 for progression to community ambulator and improve gait ability   Time 12   Period Weeks   Status Achieved               Plan - 03/11/16 1037    Clinical Impression Statement Min cuing needed to maintain posture while performing balance and strengthening tasks   Pt will benefit from skilled therapeutic intervention in order to improve on the following deficits Abnormal gait;Decreased balance;Decreased endurance;Decreased mobility;Decreased strength;Pain   Rehab Potential Good   PT Frequency 2x / week   PT Duration 12 weeks   PT Treatment/Interventions Neuromuscular re-education;Balance training;Therapeutic exercise;Therapeutic activities;Gait training;Stair training   PT Next Visit Plan Balance training   PT Home Exercise Plan  HEP corner balance exercises   Consulted and Agree with Plan of Care Patient        Problem List There are no active problems to display for this patient.  Alanson Puls, PT, DPT [place], Minette Headland S 03/11/2016, 10:39 AM  [place] MAIN [facility] SERVICES [address] [place], [place], [address] Phone: [phone]   Fax:  [phone]  Name: Angie Wilson MRN: [id number] Date of Birth: 12-08-1926

## 2016-03-13 ENCOUNTER — Encounter: Payer: Self-pay | Admitting: Physical Therapy

## 2016-03-13 ENCOUNTER — Ambulatory Visit: Payer: Medicare Other | Admitting: Physical Therapy

## 2016-03-13 DIAGNOSIS — R2681 Unsteadiness on feet: Secondary | ICD-10-CM | POA: Diagnosis not present

## 2016-03-13 DIAGNOSIS — R531 Weakness: Secondary | ICD-10-CM

## 2016-03-13 DIAGNOSIS — R262 Difficulty in walking, not elsewhere classified: Secondary | ICD-10-CM

## 2016-03-13 NOTE — Therapy (Signed)
[place] MAIN [facility] SERVICES [address] [place], [place], [address] Phone: [phone]   Fax:  [phone]  Physical Therapy Treatment  Patient Details  Name: Angie Wilson MRN: [id number] Date of Birth: 06-20-1926 Referring Provider: shah  Encounter Date: 03/13/2016      PT End of Session - 03/13/16 1038    Visit Number 31   Number of Visits 25   Date for PT Re-Evaluation 03-25-16   Authorization Type g codes   PT Start Time 02-14-29   PT Stop Time 1115   PT Time Calculation (min) 45 min   Equipment Utilized During Treatment Gait belt   Activity Tolerance Patient tolerated treatment well;Patient limited by fatigue;No increased pain      Past Medical History  Diagnosis Date  . Glaucoma     Past Surgical History  Procedure Laterality Date  . Abdominal hysterectomy      There were no vitals filed for this visit.  Visit Diagnosis:  Unsteady gait  Difficulty walking  Weakness      Subjective Assessment - 03/13/16 1038    Subjective Patient has been doing well, she is feeling a bit more steady.    Patient is accompained by: Family member   Currently in Pain? No/denies      standing hip abd with YTB x 20  side stepping left and right in parallel bars 10 feet x 3 standing on blue foam with cone reaching x 20 across midline step ups from floor to 6 inch stool x 20 bilateral sit to stand x 10 marching in parallel bars x 20 NEUROMUSCULAR RE-ED Gait in hallway with horizontal and vertical head turns 80' x 2; Standing slow marching without UE support 2 x 10; Toe taps on BOSU x 10 bilateral, alternating; Modified tandem stance eyes open/closed x 30 seconds each alternating LE; Modified tandem stance eyes open with horizontal and vertical head turns alternating LE Patient needs occasional verbal cueing to improve posture and cueing to correctly perform exercises slowly, holding at end of range to increase motor firing of  desired muscle to encourage fatigue.                             PT Education - 03/13/16 1038    Education provided Yes   Education Details HEP   Person(s) Educated Patient   Methods Explanation   Comprehension Verbalized understanding             PT Long Term Goals - 02/19/16 1648    PT LONG TERM GOAL #1   Title Patient will increase BLE gross strength to 4+/5 as to improve functional strength for independent gait, increased standing tolerance and increased ADL ability   Time 12   Period Weeks   Status Partially Met   PT LONG TERM GOAL #2   Title Patient will increase BLE gross strength to 4+/5 as to improve functional strength for independent gait, increased standing tolerance and increased ADL ability   Time 12   Period Weeks   Status Partially Met   PT LONG TERM GOAL #3   Title Patient will tolerate 5 seconds of single leg stance without loss of balance to improve ability to get in and out of shower safely   Time 12   Period Weeks   Status Partially Met   PT LONG TERM GOAL #4   Title Patient will reduce timed up and go to <11  seconds to reduce fall risk and demonstrate improved transfer/gait ability   Time 12   Period Weeks   Status Achieved   PT LONG TERM GOAL #5   Title Patient will increase six minute walk test distance to >1000 for progression to community ambulator and improve gait ability   Time 12   Period Weeks   Status Achieved               Plan - 03/13/16 1039    Clinical Impression Statement  Pt reports discomfort during but overall less joint stiffness and pain following.    Pt will benefit from skilled therapeutic intervention in order to improve on the following deficits Abnormal gait;Decreased balance;Decreased endurance;Decreased mobility;Decreased strength;Pain   Rehab Potential Good   PT Frequency 2x / week   PT Duration 12 weeks   PT Treatment/Interventions Neuromuscular re-education;Balance training;Therapeutic  exercise;Therapeutic activities;Gait training;Stair training   PT Next Visit Plan Balance training   PT Home Exercise Plan HEP corner balance exercises   Consulted and Agree with Plan of Care Patient        Problem List There are no active problems to display for this patient. Alanson Puls, PT, DPT  [place], Minette Headland S 03/13/2016, 10:40 AM  [place] MAIN [facility] SERVICES [address] [place], [place], [address] Phone: [phone]   Fax:  [phone]  Name: Angie Wilson MRN: [id number] Date of Birth: 1926/04/21

## 2016-03-18 ENCOUNTER — Ambulatory Visit: Payer: Medicare Other | Admitting: Physical Therapy

## 2016-03-20 ENCOUNTER — Ambulatory Visit: Payer: Medicare Other | Admitting: Physical Therapy

## 2016-03-25 ENCOUNTER — Encounter: Payer: Self-pay | Admitting: Physical Therapy

## 2016-03-25 ENCOUNTER — Ambulatory Visit: Payer: Medicare Other | Attending: Neurology | Admitting: Physical Therapy

## 2016-03-25 DIAGNOSIS — R2681 Unsteadiness on feet: Secondary | ICD-10-CM | POA: Insufficient documentation

## 2016-03-25 DIAGNOSIS — R262 Difficulty in walking, not elsewhere classified: Secondary | ICD-10-CM | POA: Insufficient documentation

## 2016-03-25 DIAGNOSIS — M6281 Muscle weakness (generalized): Secondary | ICD-10-CM | POA: Insufficient documentation

## 2016-03-25 NOTE — Therapy (Signed)
[place] MAIN [facility] SERVICES [address] [place], [place], [address] Phone: [phone]   Fax:  [phone]  Physical Therapy Treatment  Patient Details  Name: Angie Wilson MRN: [id number] Date of Birth: Aug 20, 1926 Referring Provider: shah  Encounter Date: 03/25/2016      PT End of Session - 03/25/16 1109    Visit Number 32   Number of Visits 41   Date for PT Re-Evaluation 05/18/2016   Authorization Type g codes   PT Start Time 03/07/1029   PT Stop Time 1115   PT Time Calculation (min) 45 min   Equipment Utilized During Treatment Gait belt   Activity Tolerance Patient tolerated treatment well;Patient limited by fatigue;No increased pain   Behavior During Therapy [facility] for tasks assessed/performed      Past Medical History  Diagnosis Date  . Glaucoma     Past Surgical History  Procedure Laterality Date  . Abdominal hysterectomy      There were no vitals filed for this visit.      Subjective Assessment - 03/25/16 1032    Subjective Pt reports she has had a recent episode of bronchitis but she thinks she is "about over it". No falls since last visit.   Patient is accompained by: Family member   Currently in Pain? No/denies       Warm up: Nustep L3 x5 min (unbillied)  Therex:  Standing hip abd, SLR and ext with YTB 2x10 Standing hip flexion with 3# 2x10 STS x  10 without UE Heel raises 2x10  Balance: Airex: narrow stance, tandem stance with HT and no UE support x 2 min each Airex: marching without UE support x 20 Airex: step ups without UE support x 20 Cues for weight shifting and postural correction. Quantum resisted walking with 12.5#: F/B, L/R and R/L x 2 each Cues for increased BOS, step length and weight shifting to increase control and stability.                              PT Education - 03/25/16 1055    Education provided Yes   Education Details daily walking program for total of 30  minutes per day in tolerable increments with SPC and supervision for outdoors   Person(s) Educated Patient   Methods Explanation   Comprehension Verbalized understanding             PT Long Term Goals - 03/25/16 1200    PT LONG TERM GOAL #1   Title Patient will increase BLE gross strength to 4+/5 as to improve functional strength for independent gait, increased standing tolerance and increased ADL ability   Time 12   Period Weeks   Status Partially Met   PT LONG TERM GOAL #2   Title Patient will increase BLE gross strength to 4+/5 as to improve functional strength for independent gait, increased standing tolerance and increased ADL ability   Time 12   Period Weeks   Status Partially Met   PT LONG TERM GOAL #3   Title Patient will tolerate 5 seconds of single leg stance without loss of balance to improve ability to get in and out of shower safely   Time 12   Period Weeks   Status Partially Met   PT LONG TERM GOAL #4   Title Patient will reduce timed up and go to <11 seconds to reduce fall risk and demonstrate improved transfer/gait ability  Time 12   Period Weeks   Status Achieved   PT LONG TERM GOAL #5   Title Patient will increase six minute walk test distance to >1000 for progression to community ambulator and improve gait ability   Time 12   Period Weeks   Status Achieved               Plan - 03/25/16 1110    Clinical Impression Statement Pt continues to have higher level balance deficits and LE weakness. She is unsteady during dynamic balance tasks and requires CGA to min A with cues for increased stability. Pt will benefit from continued LE strengthening and high level dynamic balance training to increase functional independence and safety.   Rehab Potential Good   PT Frequency 2x / week   PT Duration 4 weeks   PT Treatment/Interventions Neuromuscular re-education;Balance training;Therapeutic exercise;Therapeutic activities;Gait training;Stair training   PT  Next Visit Plan Balance and strength training   PT Home Exercise Plan HEP corner balance exercises   Consulted and Agree with Plan of Care Patient      Patient will benefit from skilled therapeutic intervention in order to improve the following deficits and impairments:  Abnormal gait, Decreased balance, Decreased endurance, Decreased mobility, Decreased strength, Pain  Visit Diagnosis: Unsteadiness on feet - Plan: PT plan of care cert/re-cert  Difficulty in walking, not elsewhere classified - Plan: PT plan of care cert/re-cert  Muscle weakness (generalized) - Plan: PT plan of care cert/re-cert     Problem List There are no active problems to display for this patient.   Kanaan Kagawa Shiela Mayer, PT, DPT  03/25/2016, 1:06 PM [place] MAIN [facility] SERVICES [address] [place], [place], [address] Phone: [phone]   Fax:  [phone]  Name: KORTNE ALL MRN: [id number] Date of Birth: [birth year]

## 2016-03-25 NOTE — Patient Instructions (Addendum)
Discussed daily walking program for up to 30 minutes in tolerable increments with SPC and supervision outdoors for continued endurance and strength training.

## 2016-03-27 ENCOUNTER — Ambulatory Visit: Payer: Medicare Other | Admitting: Physical Therapy

## 2016-03-27 ENCOUNTER — Encounter: Payer: Self-pay | Admitting: Physical Therapy

## 2016-03-27 DIAGNOSIS — M6281 Muscle weakness (generalized): Secondary | ICD-10-CM

## 2016-03-27 DIAGNOSIS — R262 Difficulty in walking, not elsewhere classified: Secondary | ICD-10-CM

## 2016-03-27 DIAGNOSIS — R2681 Unsteadiness on feet: Secondary | ICD-10-CM

## 2016-03-27 NOTE — Therapy (Signed)
[place] [facility] REHAB SERVICES [address] [place], [place], [address] Phone: [phone]   Fax:  [phone]  Physical Therapy Treatment  Patient Details  Name: Angie Wilson MRN: [id number] Date of Birth: 09/14/1926 Referring Provider: shah  Encounter Date: 03/27/2016      PT End of Session - 03/27/16 1054    Visit Number 33   Number of Visits 41   Date for PT Re-Evaluation 04/22/16   Authorization Type g codes   PT Start Time 1030   PT Stop Time 1115   PT Time Calculation (min) 45 min   Equipment Utilized During Treatment Gait belt   Activity Tolerance Patient tolerated treatment well;Patient limited by fatigue;No increased pain   Behavior During Therapy WFL for tasks assessed/performed      Past Medical History  Diagnosis Date  . Glaucoma     Past Surgical History  Procedure Laterality Date  . Abdominal hysterectomy      There were no vitals filed for this visit.      Subjective Assessment - 03/27/16 1049    Subjective Pt reports she is feeling well and has no new complaints.   Currently in Pain? No/denies      Warm up: Nustep L3 x5 min (unbillied)  Therex:  Standing hip SLR, abd, ext with RTB 3x10 each Side stepping in parallel bars with RTB around knees x6 STS without UE support x 15 Heel raises x 20 Eccentric step downs on 6 inch step x 20 each BOSU (blue side) forward lunges x15 each  Cues for staying on task and proper exercise technique to target appropriate muscles.                            PT Education - 03/27/16 1053    Education provided Yes   Education Details reviewed importance of daily exercises   Person(s) Educated Patient   Methods Explanation   Comprehension Verbalized understanding             PT Long Term Goals - 03/25/16 1200    PT LONG TERM GOAL #1   Title Patient will increase BLE gross strength to 4+/5 as to improve functional strength for  independent gait, increased standing tolerance and increased ADL ability   Time 12   Period Weeks   Status Partially Met   PT LONG TERM GOAL #2   Title Patient will increase BLE gross strength to 4+/5 as to improve functional strength for independent gait, increased standing tolerance and increased ADL ability   Time 12   Period Weeks   Status Partially Met   PT LONG TERM GOAL #3   Title Patient will tolerate 5 seconds of single leg stance without loss of balance to improve ability to get in and out of shower safely   Time 12   Period Weeks   Status Partially Met   PT LONG TERM GOAL #4   Title Patient will reduce timed up and go to <11 seconds to reduce fall risk and demonstrate improved transfer/gait ability   Time 12   Period Weeks   Status Achieved   PT LONG TERM GOAL #5   Title Patient will increase six minute walk test distance to >1000 for progression to community ambulator and improve gait ability   Time 12   Period Weeks   Status Achieved                 Plan - 03/27/16 1054    Clinical Impression Statement Pt demonstrates impulsiveness that reduces her safety awareness and requires cues for correction. She required frequent cues for staying on task and proper technique of exercises.   Rehab Potential Good   PT Frequency 2x / week   PT Duration 4 weeks   PT Treatment/Interventions Neuromuscular re-education;Balance training;Therapeutic exercise;Therapeutic activities;Gait training;Stair training   PT Next Visit Plan Balance and strength training   PT Home Exercise Plan HEP corner balance exercises   Consulted and Agree with Plan of Care Patient      Patient will benefit from skilled therapeutic intervention in order to improve the following deficits and impairments:  Abnormal gait, Decreased balance, Decreased endurance, Decreased mobility, Decreased strength, Pain  Visit Diagnosis: Unsteadiness on feet  Difficulty in walking, not elsewhere  classified  Muscle weakness (generalized)     Problem List There are no active problems to display for this patient.   Marthann Abshier Shiela Mayer, PT, DPT  03/27/2016, 11:21 AM [place] MAIN [facility] SERVICES [address] [place], [place], [address] Phone: [phone]   Fax:  [phone]  Name: Angie Wilson MRN: [id number] Date of Birth: 1926-06-22

## 2016-04-01 ENCOUNTER — Encounter: Payer: Self-pay | Admitting: Physical Therapy

## 2016-04-01 ENCOUNTER — Ambulatory Visit: Payer: Medicare Other | Admitting: Physical Therapy

## 2016-04-01 DIAGNOSIS — M6281 Muscle weakness (generalized): Secondary | ICD-10-CM

## 2016-04-01 DIAGNOSIS — R2681 Unsteadiness on feet: Secondary | ICD-10-CM

## 2016-04-01 DIAGNOSIS — R262 Difficulty in walking, not elsewhere classified: Secondary | ICD-10-CM

## 2016-04-01 NOTE — Therapy (Addendum)
[place] MAIN [facility] SERVICES [address] [place], [place], [address] Phone: [phone]   Fax:  [phone]  Physical Therapy Treatment  Patient Details  Name: Angie Wilson MRN: [id number] Date of Birth: 12-18-1925 Referring Provider: shah  Encounter Date: 04/01/2016      PT End of Session - 04/01/16 1040    Visit Number 34   Number of Visits 41   Date for PT Re-Evaluation 2016-05-08   Authorization Type g codes   PT Start Time Feb 12, 1029   PT Stop Time 1115   PT Time Calculation (min) 45 min   Equipment Utilized During Treatment Gait belt   Activity Tolerance Patient tolerated treatment well;Patient limited by fatigue;No increased pain   Behavior During Therapy [facility] for tasks assessed/performed      Past Medical History  Diagnosis Date  . Glaucoma     Past Surgical History  Procedure Laterality Date  . Abdominal hysterectomy      There were no vitals filed for this visit.      Subjective Assessment - 04/01/16 1039    Subjective Pt reports she is feeling well and has no new complaints.   Patient is accompained by: Family member   Currently in Pain? No/denies     Therapeutic exercise;  Tm walking side stepping left and right x 4 minutes standing hip abd with YTB x 20  side stepping left and right in parallel bars 10 feet x 3 standing on blue foam with cone reaching x 20 across midline step ups from floor to 6 inch stool x 20 bilateral sit to stand x 10 marching in parallel bars x 20 stepping pattern with weight shifting fwd/bwd x 10.  Min cueing needed to appropriately perform  tasks with leg, hand, and head position. Decreased coordination demonstrated requiring consistent verbal cueing to correct form. Patient continues to demonstrate some in coordination of movement with select exercises such as stepping backwards. Patient responds well to verbal and tactile cues to correct form and technique.  CGA  for safety with  activities.     g codes: Current CJ 20-40 % 8978 Goal CI 1-20 % 8979 6 Mw 1150 TUG 10.50 Patient is partially met her goals                       PT Education - 04/01/16 1040    Education provided Yes   Person(s) Educated Patient   Methods Explanation   Comprehension Verbalized understanding             PT Long Term Goals - 03/25/16 1200    PT LONG TERM GOAL #1   Title Patient will increase BLE gross strength to 4+/5 as to improve functional strength for independent gait, increased standing tolerance and increased ADL ability   Time 12   Period Weeks   Status Partially Met   PT LONG TERM GOAL #2   Title Patient will increase BLE gross strength to 4+/5 as to improve functional strength for independent gait, increased standing tolerance and increased ADL ability   Time 12   Period Weeks   Status Partially Met   PT LONG TERM GOAL #3   Title Patient will tolerate 5 seconds of single leg stance without loss of balance to improve ability to get in and out of shower safely   Time 12   Period Weeks   Status Partially Met   PT LONG TERM GOAL #4   Title Patient  will reduce timed up and go to <11 seconds to reduce fall risk and demonstrate improved transfer/gait ability   Time 12   Period Weeks   Status Achieved   PT LONG TERM GOAL #5   Title Patient will increase six minute walk test distance to >1000 for progression to community ambulator and improve gait ability   Time 12   Period Weeks   Status Achieved               Plan - 04/01/16 1040    Clinical Impression Statement Patient performs balance and exercises to strengthen LE's and decrease her falls risk. She demonstrates reduced safety awarness with speed of walking.   Rehab Potential Good   PT Frequency 2x / week   PT Duration 4 weeks   PT Treatment/Interventions Neuromuscular re-education;Balance training;Therapeutic exercise;Therapeutic activities;Gait training;Stair training   PT Next  Visit Plan Balance and strength training   PT Home Exercise Plan HEP corner balance exercises   Consulted and Agree with Plan of Care Patient      Patient will benefit from skilled therapeutic intervention in order to improve the following deficits and impairments:  Abnormal gait, Decreased balance, Decreased endurance, Decreased mobility, Decreased strength, Pain  Visit Diagnosis: Unsteadiness on feet  Difficulty in walking, not elsewhere classified  Muscle weakness (generalized)     Problem List There are no active problems to display for this patient.  Alanson Puls, PT, DPT [place], Minette Headland S 04/01/2016, 10:53 AM  [place] MAIN [facility] SERVICES [address] [place], [place], [address] Phone: [phone]   Fax:  [phone]  Name: Angie Wilson MRN: [id number] Date of Birth: 23-Mar-1926

## 2016-04-03 ENCOUNTER — Ambulatory Visit: Payer: Medicare Other | Admitting: Physical Therapy

## 2016-04-03 ENCOUNTER — Encounter: Payer: Self-pay | Admitting: Physical Therapy

## 2016-04-03 DIAGNOSIS — R2681 Unsteadiness on feet: Secondary | ICD-10-CM | POA: Diagnosis not present

## 2016-04-03 DIAGNOSIS — M6281 Muscle weakness (generalized): Secondary | ICD-10-CM

## 2016-04-03 DIAGNOSIS — R262 Difficulty in walking, not elsewhere classified: Secondary | ICD-10-CM

## 2016-04-03 NOTE — Patient Instructions (Signed)
Single Leg - Eyes Open  Holding support, lift right leg while maintaining balance over other leg. Progress to removing hands from support surface for longer periods of time. Hold____ seconds. Repeat ____ times per session. Do ____ sessions per day. Single Leg (Compliant Surface) - Eyes Open  Stand on compliant surface: ________ holding support. Lift right leg while maintaining balance over other leg. Progress to removing hands from support surface for longer periods of time. Hold____ seconds. Repeat ____ times per session. Do ____ sessions per day.  Feet Heel-Toe "Tandem", Arm Motion  Eyes Open  With eyes open, right foot directly in front of the other, move arms up and down: to front. Repeat ____ times per session. Do ____ sessions per day. Feet Heel-Toe "Tandem" (Compliant Surface) Arm Motion - Eyes Open  With eyes open, standing on compliant surface: ________, right foot directly in front of the other, move arms up and down: to front. Repeat ____ times per session. Do ____ per day.  Feet Heel-Toe "Tandem"   Arms outstretched, walk a straight line bringing one foot directly in front of the other. Repeat for ____ minutes per session. _ per day. Side-Stepping   Walk to left side with eyes open. Take even steps, leading with same foot. Repeat in other direction. Repeat for _ minutes per session.Do __ per day.   Braiding   Move to side: 1) cross right leg in front of left, 2) bring back leg out to side, then 3) cross right leg behind left, 4) bring left leg out to side. Continue sequence in same direction. Reverse sequence, moving in opposite direction. Repeat sequence ____ times per session. Do ____ sessions per day. Repeat on compliant surface: ________.  Copyright  VHI. All rights reserved.   

## 2016-04-03 NOTE — Therapy (Signed)
[place] [facility] MAIN [facility] SERVICES [address] [place], [place], [address] Phone: [phone]   Fax:  [phone]  Physical Therapy Treatment/ Discharge summary Patient Details  Name: IMARI QUIRINDONGO MRN: [id number] Date of Birth: 1926-05-07 Referring Provider: shah  Encounter Date: 04/03/2016      PT End of Session - 04/03/16 1054    Visit Number 35   Number of Visits 41   Date for PT Re-Evaluation 05/17/2016   Authorization Type g codes   PT Start Time 1035   PT Stop Time 1115   PT Time Calculation (min) 40 min   Equipment Utilized During Treatment Gait belt   Activity Tolerance Patient tolerated treatment well;Patient limited by fatigue;No increased pain   Behavior During Therapy [facility] for tasks assessed/performed      Past Medical History  Diagnosis Date  . Glaucoma     Past Surgical History  Procedure Laterality Date  . Abdominal hysterectomy      There were no vitals filed for this visit.      Subjective Assessment - 04/03/16 1053    Subjective Pt reports she is feeling well and has no new complaints.   Patient is accompained by: Family member   Currently in Pain? No/denies       OUTCOME MEASURES: TEST Outcome Interpretation  5 times sit<>stand 14.50ec >73 yo, >15 sec indicates increased risk for falls  10 meter walk test  1.33 m/s <1.0 m/s indicates increased risk for falls; limited community ambulator  Timed up and Go  11.10 sec <14 sec indicates increased risk for falls  6 minute walk test  1230 Feet 1000 feet is community ambulator                 Neuromuscular training: NEUROMUSCULAR RE-ED Gait in hallway with horizontal and vertical head turns 80' x 2; Standing slow marching without UE support 2 x 10; Toe taps on BOSU x 10 bilateral, alternating; Modified tandem stance eyes open/closed x 30 seconds each alternating LE; Modified tandem stance eyes open  with horizontal and vertical head turns alternating                        PT Education - 04/03/16 1054    Education provided Yes   Education Details HEP review for DC   Person(s) Educated Patient   Methods Explanation   Comprehension Verbalized understanding             PT Long Term Goals - 04/03/16 1055    PT LONG TERM GOAL #1   Title Patient will increase BLE gross strength to 4+/5 as to improve functional strength for independent gait, increased standing tolerance and increased ADL ability   Status Achieved   PT LONG TERM GOAL #2   Title Patient will increase BLE gross strength to 4+/5 as to improve functional strength for independent gait, increased standing tolerance and increased ADL ability   Status Achieved   PT LONG TERM GOAL #3   Title Patient will tolerate 5 seconds of single leg stance without loss of balance to improve ability to get in and out of shower safely   Status Achieved   PT LONG TERM GOAL #4   Title Patient will reduce timed up and go to <11 seconds to reduce fall risk and demonstrate improved transfer/gait ability   Status Achieved   PT LONG TERM GOAL #5   Title Patient will increase six minute walk test distance  to >1000 for progression to community ambulator and improve gait ability   Status Achieved               Plan - 04/03/16 1054    Clinical Impression Statement Pateint has reached goals and decresed her falls risk and will be DC form PT . She continues to be able to perform dynamic standing balance activities with SBA.   Rehab Potential Good   PT Frequency 2x / week   PT Duration 4 weeks   PT Treatment/Interventions Neuromuscular re-education;Balance training;Therapeutic exercise;Therapeutic activities;Gait training;Stair training   PT Next Visit Plan Balance and strength training   PT Home Exercise Plan HEP corner balance exercises   Consulted and Agree with Plan of Care Patient      Patient will benefit from  skilled therapeutic intervention in order to improve the following deficits and impairments:  Abnormal gait, Decreased balance, Decreased endurance, Decreased mobility, Decreased strength, Pain  Visit Diagnosis: Unsteadiness on feet  Difficulty in walking, not elsewhere classified  Muscle weakness (generalized)       G-Codes - Apr 19, 2016 1212    Functional Assessment Tool Used 5 x sit to stand, TUG, 10 MW, 6 MW   Mobility: Walking and Moving Around Current Status [phone]) At least 20 percent but less than 40 percent impaired, limited or restricted   Mobility: Walking and Moving Around Goal Status [phone]) At least 1 percent but less than 20 percent impaired, limited or restricted      Problem List There are no active problems to display for this patient. [name], PT, DPT  [place], Barkley Bruns S 04/03/2016, 10:57 AM  [place] [facility] MAIN [facility] SERVICES [address] [place], [place], [address] Phone: [phone]   Fax:  [phone]  Name: NARCISSUS DETWILER MRN: [id number] Date of Birth: 1926-10-26

## 2016-04-08 ENCOUNTER — Ambulatory Visit: Payer: Medicare Other | Admitting: Physical Therapy

## 2016-04-10 ENCOUNTER — Ambulatory Visit: Payer: Medicare Other | Admitting: Physical Therapy

## 2016-10-24 ENCOUNTER — Other Ambulatory Visit: Payer: Self-pay | Admitting: Neurosurgery

## 2016-10-24 DIAGNOSIS — I671 Cerebral aneurysm, nonruptured: Secondary | ICD-10-CM

## 2016-11-10 ENCOUNTER — Ambulatory Visit
Admission: RE | Admit: 2016-11-10 | Discharge: 2016-11-10 | Disposition: A | Discharge status: Home / Self Care | Payer: Medicare Other | Source: Ambulatory Visit | Attending: Neurosurgery | Admitting: Neurosurgery

## 2016-11-10 DIAGNOSIS — I671 Cerebral aneurysm, nonruptured: Secondary | ICD-10-CM | POA: Diagnosis not present

## 2016-11-10 DIAGNOSIS — J323 Chronic sphenoidal sinusitis: Secondary | ICD-10-CM | POA: Insufficient documentation

## 2019-09-17 ENCOUNTER — Other Ambulatory Visit: Payer: Self-pay

## 2019-09-17 ENCOUNTER — Emergency Department
Admission: EM | Admit: 2019-09-17 | Discharge: 2019-09-17 | Disposition: A | Discharge status: Home / Self Care | Payer: Medicare Other | Attending: Student | Admitting: Student

## 2019-09-17 ENCOUNTER — Encounter: Payer: Self-pay | Admitting: Emergency Medicine

## 2019-09-17 ENCOUNTER — Emergency Department: Payer: Medicare Other

## 2019-09-17 DIAGNOSIS — S51811A Laceration without foreign body of right forearm, initial encounter: Secondary | ICD-10-CM

## 2019-09-17 DIAGNOSIS — W06XXXA Fall from bed, initial encounter: Secondary | ICD-10-CM | POA: Insufficient documentation

## 2019-09-17 DIAGNOSIS — Z87891 Personal history of nicotine dependence: Secondary | ICD-10-CM | POA: Diagnosis not present

## 2019-09-17 DIAGNOSIS — Y92013 Bedroom of single-family (private) house as the place of occurrence of the external cause: Secondary | ICD-10-CM | POA: Diagnosis not present

## 2019-09-17 DIAGNOSIS — Y9389 Activity, other specified: Secondary | ICD-10-CM | POA: Insufficient documentation

## 2019-09-17 DIAGNOSIS — S0083XA Contusion of other part of head, initial encounter: Secondary | ICD-10-CM | POA: Diagnosis not present

## 2019-09-17 DIAGNOSIS — Y999 Unspecified external cause status: Secondary | ICD-10-CM | POA: Insufficient documentation

## 2019-09-17 DIAGNOSIS — S0990XA Unspecified injury of head, initial encounter: Secondary | ICD-10-CM | POA: Diagnosis present

## 2019-09-17 DIAGNOSIS — W19XXXA Unspecified fall, initial encounter: Secondary | ICD-10-CM

## 2019-09-17 LAB — BASIC METABOLIC PANEL
Anion gap: 10 (ref 5–15)
BUN: 24 mg/dL — ABNORMAL HIGH (ref 8–23)
CO2: 31 mmol/L (ref 22–32)
Calcium: 9.7 mg/dL (ref 8.9–10.3)
Chloride: 100 mmol/L (ref 98–111)
Creatinine, Ser: 0.92 mg/dL (ref 0.44–1.00)
GFR calc Af Amer: >60 mL/min (ref >60)
GFR calc non Af Amer: 54 mL/min — ABNORMAL LOW (ref >60)
Glucose, Bld: 74 mg/dL (ref 70–99)
Potassium: 3.7 mmol/L (ref 3.5–5.1)
Sodium: 141 mmol/L (ref 135–145)

## 2019-09-17 LAB — CBC
HCT: 41.4 % (ref 36.0–46.0)
Hemoglobin: 13.6 g/dL (ref 12.0–15.0)
MCH: 31.6 pg (ref 26.0–34.0)
MCHC: 32.9 g/dL (ref 30.0–36.0)
MCV: 96.3 fL (ref 80.0–100.0)
Platelets: 242 10*3/uL (ref 150–400)
RBC: 4.3 MIL/uL (ref 3.87–5.11)
RDW: 13.2 % (ref 11.5–15.5)
WBC: 7.6 10*3/uL (ref 4.0–10.5)
nRBC: 0 % (ref 0.0–0.2)

## 2019-09-17 MED ORDER — DOXYCYCLINE HYCLATE 100 MG PO TABS: 100.0000 mg | ORAL_TABLET | Freq: Two times a day (BID) | ORAL | 0 refills | Status: DC

## 2019-09-17 NOTE — ED Triage Notes (Addendum)
Pt arrived via POV with granddaughter, states pt fell getting out of bed going to the bathroom this morning around 5am.    Per granddaughter, she came right over to see patient after she had fallen. Denies any LOC.  Pt has large skin tear to right arm with large area of bruising noted. Granddaughter also states that after she helped patient bathe this morning she noted bruising around her right eye and face.  Pt does not take any blood thinning mediations and is not currently on any ASA.  Arm currently wrapped in gauze from home.

## 2019-09-17 NOTE — ED Notes (Signed)
Pt transported to CT ?

## 2019-09-17 NOTE — ED Notes (Signed)
Discussed with Dr. Jari Pigg, new orders received for CT, XR and labs.

## 2019-09-17 NOTE — ED Provider Notes (Signed)
[facility] Emergency Department Provider Note  ____________________________________________  Time seen: Approximately 3:11 PM  I have reviewed the triage vital signs and the nursing notes.   HISTORY  Chief Complaint Fall    HPI Angie Wilson is a 85 y.o. female who presents the emergency department after a fall.  Patient sustained a mechanical fall while getting out of the bed to use the restroom this morning.   Patient's granddaughter presented to the house immediately after the fall.  Patient had sustained no loss of consciousness.  Patient did sustain a skin tear to the right arm that was dressed at home.  Granddaughter became concerned as the patient developed bruising around her eyes.  Patient has had no subsequent loss of consciousness.  Other than skin tear patient has no complaint.  Patient is not on any anticoagulation or aspirin.          Past Medical History:  Diagnosis Date  . Glaucoma     There are no active problems to display for this patient.   Past Surgical History:  Procedure Laterality Date  . ABDOMINAL HYSTERECTOMY      Prior to Admission medications   Medication Sig Start Date End Date Taking? Authorizing Provider  doxycycline (VIBRA-TABS) 100 MG tablet Take 1 tablet (100 mg total) by mouth 2 (two) times daily. 09/17/19   Cuthriell, Delorise Royals, PA-C    Allergies Other, Penicillins, Prednisone, and Sulfa antibiotics  History reviewed. No pertinent family history.  Social History Social History   Tobacco Use  . Smoking status: Former Games developer  . Smokeless tobacco: Never Used  Substance Use Topics  . Alcohol use: Yes  . Drug use: Not on file     Review of Systems  Constitutional: No fever/chills Eyes: No visual changes. No discharge ENT: No upper respiratory complaints. Cardiovascular: no chest pain. Respiratory: no cough. No SOB. Gastrointestinal: No abdominal pain.  No nausea, no vomiting.  No diarrhea.  No  constipation. Genitourinary: Negative for dysuria. No hematuria Musculoskeletal: Negative for musculoskeletal pain. Skin: Positive for skin tear to the right forearm Neurological: Negative for headaches, focal weakness or numbness. 10-point ROS otherwise negative.  ____________________________________________   PHYSICAL EXAM:  VITAL SIGNS: ED Triage Vitals [09/17/19 1206]  Enc Vitals Group     BP (!) 118/54     Pulse Rate 64     Resp 18     Temp 97.7 F (36.5 C)     Temp Source Oral     SpO2 95 %     Weight 120 lb (54.4 kg)     Height 5\' 4"  (1.626 m)     Head Circumference      Peak Flow      Pain Score      Pain Loc      Pain Edu?      Excl. in GC?      Constitutional: Alert and oriented. Well appearing and in no acute distress. Eyes: Conjunctivae are normal. PERRL. EOMI. Head: Visualization of the skull and face reveals no deformity, lacerations or hematomas.  Patient does have mild raccoon eyes.  No battle signs.  No serosanguineous fluid drainage from the ears or nares.  Patient is nontender to palpation of the osseous structures of the skull.  Mild periorbital tenderness to palpation. ENT:      Ears:       Nose: No congestion/rhinnorhea.      Mouth/Throat: Mucous membranes are moist.  Neck: No stridor.  No cervical  spine tenderness to palpation.  Cardiovascular: Normal rate, regular rhythm. Normal S1 and S2.  Good peripheral circulation. Respiratory: Normal respiratory effort without tachypnea or retractions. Lungs CTAB. Good air entry to the bases with no decreased or absent breath sounds. Musculoskeletal: Full range of motion to all extremities. No gross deformities appreciated. Neurologic:  Normal speech and language. No gross focal neurologic deficits are appreciated.  Skin:  Skin is warm, dry and intact. No rash noted.  Patient has a skin tear to the right forearm.  The proximal aspect aligns well, however the lateral portion of the skin tear has a gap of  approximately 2 cm from edge to edge.  No active bleeding.  No visible foreign body.  Examination of the elbow and wrist is unremarkable to the right upper extremity.  Radial pulse and sensation intact distally. Psychiatric: Mood and affect are normal. Speech and behavior are normal. Patient exhibits appropriate insight and judgement.   ____________________________________________   LABS (all labs ordered are listed, but only abnormal results are displayed)  Labs Reviewed  BASIC METABOLIC PANEL - Abnormal; Notable for the following components:      Result Value   BUN 24 (*)    GFR calc non Af Amer 54 (*)    All other components within normal limits  CBC   ____________________________________________  EKG   ____________________________________________  RADIOLOGY I personally viewed and evaluated these images as part of my medical decision making, as well as reviewing the written report by the radiologist.  Dg Forearm Right  Result Date: 09/17/2019 CLINICAL DATA:  Pain status post fall EXAM: RIGHT FOREARM - 2 VIEW COMPARISON:  None. FINDINGS: There is soft tissue swelling about the forearm without evidence for an acute displaced fracture or dislocation. IMPRESSION: Soft tissue swelling without an acute osseous abnormality. Electronically Signed   By: Constance Holster M.D.   On: 09/17/2019 12:58   Ct Head Wo Contrast  Result Date: 09/17/2019 CLINICAL DATA:  Head trauma, fall EXAM: CT HEAD WITHOUT CONTRAST CT MAXILLOFACIAL WITHOUT CONTRAST CT CERVICAL SPINE WITHOUT CONTRAST TECHNIQUE: Multidetector CT imaging of the head, cervical spine, and maxillofacial structures were performed using the standard protocol without intravenous contrast. Multiplanar CT image reconstructions of the cervical spine and maxillofacial structures were also generated. COMPARISON:  03/31/2012 FINDINGS: CT HEAD FINDINGS Brain: No evidence of acute infarction, hemorrhage, hydrocephalus, extra-axial collection or  mass lesion/mass effect. Extensive periventricular and deep white matter hypodensity and volume loss. Vascular: No hyperdense vessel or unexpected calcification. CT FACIAL BONES FINDINGS Skull: Normal. Negative for fracture or focal lesion. Facial bones: No displaced fractures or dislocations. There is a circular defect in the left nasal bone and adjacent maxilla, presumably postoperative (series 6, image 17). Sinuses/Orbits: No acute finding. There is chronic opacification of the right sphenoid sinus with bony thickening of the sinus walls. Other: None. CT CERVICAL SPINE FINDINGS Alignment: Degenerative straightening of the normal cervical lordosis with minimal degenerative anterolisthesis of C4 on C5. Skull base and vertebrae: No acute fracture. No primary bone lesion or focal pathologic process. Soft tissues and spinal canal: No prevertebral fluid or swelling. No visible canal hematoma. Disc levels: Moderate multilevel disc space height loss and osteophytosis. Upper chest: Negative. Other: None. IMPRESSION: 1. No acute intracranial pathology. Advanced small-vessel white matter disease. 2. There is chronic sinusitis of the right sphenoid sinus with bony thickening of the sinus walls. 3.  No displaced fracture or dislocation of the facial bones. 4. No fracture or static subluxation of the cervical  spine. Multilevel disc degenerative disease. Electronically Signed   By: Lauralyn Primes M.D.   On: 09/17/2019 13:10   Ct Cervical Spine Wo Contrast  Result Date: 09/17/2019 CLINICAL DATA:  Head trauma, fall EXAM: CT HEAD WITHOUT CONTRAST CT MAXILLOFACIAL WITHOUT CONTRAST CT CERVICAL SPINE WITHOUT CONTRAST TECHNIQUE: Multidetector CT imaging of the head, cervical spine, and maxillofacial structures were performed using the standard protocol without intravenous contrast. Multiplanar CT image reconstructions of the cervical spine and maxillofacial structures were also generated. COMPARISON:  03/31/2012 FINDINGS: CT HEAD  FINDINGS Brain: No evidence of acute infarction, hemorrhage, hydrocephalus, extra-axial collection or mass lesion/mass effect. Extensive periventricular and deep white matter hypodensity and volume loss. Vascular: No hyperdense vessel or unexpected calcification. CT FACIAL BONES FINDINGS Skull: Normal. Negative for fracture or focal lesion. Facial bones: No displaced fractures or dislocations. There is a circular defect in the left nasal bone and adjacent maxilla, presumably postoperative (series 6, image 17). Sinuses/Orbits: No acute finding. There is chronic opacification of the right sphenoid sinus with bony thickening of the sinus walls. Other: None. CT CERVICAL SPINE FINDINGS Alignment: Degenerative straightening of the normal cervical lordosis with minimal degenerative anterolisthesis of C4 on C5. Skull base and vertebrae: No acute fracture. No primary bone lesion or focal pathologic process. Soft tissues and spinal canal: No prevertebral fluid or swelling. No visible canal hematoma. Disc levels: Moderate multilevel disc space height loss and osteophytosis. Upper chest: Negative. Other: None. IMPRESSION: 1. No acute intracranial pathology. Advanced small-vessel white matter disease. 2. There is chronic sinusitis of the right sphenoid sinus with bony thickening of the sinus walls. 3.  No displaced fracture or dislocation of the facial bones. 4. No fracture or static subluxation of the cervical spine. Multilevel disc degenerative disease. Electronically Signed   By: Lauralyn Primes M.D.   On: 09/17/2019 13:10   Ct Maxillofacial Wo Contrast  Result Date: 09/17/2019 CLINICAL DATA:  Head trauma, fall EXAM: CT HEAD WITHOUT CONTRAST CT MAXILLOFACIAL WITHOUT CONTRAST CT CERVICAL SPINE WITHOUT CONTRAST TECHNIQUE: Multidetector CT imaging of the head, cervical spine, and maxillofacial structures were performed using the standard protocol without intravenous contrast. Multiplanar CT image reconstructions of the cervical  spine and maxillofacial structures were also generated. COMPARISON:  03/31/2012 FINDINGS: CT HEAD FINDINGS Brain: No evidence of acute infarction, hemorrhage, hydrocephalus, extra-axial collection or mass lesion/mass effect. Extensive periventricular and deep white matter hypodensity and volume loss. Vascular: No hyperdense vessel or unexpected calcification. CT FACIAL BONES FINDINGS Skull: Normal. Negative for fracture or focal lesion. Facial bones: No displaced fractures or dislocations. There is a circular defect in the left nasal bone and adjacent maxilla, presumably postoperative (series 6, image 17). Sinuses/Orbits: No acute finding. There is chronic opacification of the right sphenoid sinus with bony thickening of the sinus walls. Other: None. CT CERVICAL SPINE FINDINGS Alignment: Degenerative straightening of the normal cervical lordosis with minimal degenerative anterolisthesis of C4 on C5. Skull base and vertebrae: No acute fracture. No primary bone lesion or focal pathologic process. Soft tissues and spinal canal: No prevertebral fluid or swelling. No visible canal hematoma. Disc levels: Moderate multilevel disc space height loss and osteophytosis. Upper chest: Negative. Other: None. IMPRESSION: 1. No acute intracranial pathology. Advanced small-vessel white matter disease. 2. There is chronic sinusitis of the right sphenoid sinus with bony thickening of the sinus walls. 3.  No displaced fracture or dislocation of the facial bones. 4. No fracture or static subluxation of the cervical spine. Multilevel disc degenerative disease. Electronically Signed  By: Lauralyn PrimesAlex  Bibbey M.D.   On: 09/17/2019 13:10    ____________________________________________    PROCEDURES  Procedure(s) performed:    Procedures    Medications - No data to display   ____________________________________________   INITIAL IMPRESSION / ASSESSMENT AND PLAN / ED COURSE  Pertinent labs & imaging results that were  available during my care of the patient were reviewed by me and considered in my medical decision making (see chart for details).  Review of the [place] CSRS was performed in accordance of the [facility] prior to dispensing any controlled drugs.           Patient's diagnosis is consistent with fall, facial contusion, skin tear to the right arm.  Patient presented to the emergency department after mechanical fall while attempting to ambulate to the restroom.  Patient did have some mild raccoon eyes and was not on any anticoagulation.  Imaging reveals no acute fractures to the skull or face or intracranial hemorrhage..  Skin tears clean, dressed with nonadherent dressing.  Patient will be placed on antibiotics prophylactically.  Patient is allergic to penicillin and sulfa antibiotics.  As such patient will be placed on doxycycline.  Labs are stable at this time.  Otherwise, patient is stable.  No further work-up.  Patient is given ED precautions to return to the ED for any worsening or new symptoms.     ____________________________________________  FINAL CLINICAL IMPRESSION(S) / ED DIAGNOSES  Final diagnoses:  Fall, initial encounter  Contusion of face, initial encounter  Skin tear of right forearm without complication, initial encounter      NEW MEDICATIONS STARTED DURING THIS VISIT:  ED Discharge Orders         Ordered    doxycycline (VIBRA-TABS) 100 MG tablet  2 times daily     09/17/19 1555              This chart was dictated using voice recognition software/Dragon. Despite best efforts to proofread, errors can occur which can change the meaning. Any change was purely unintentional.    Racheal PatchesCuthriell, Jonathan D, PA-C 09/17/19 1556    Miguel AschoffMonks, Sarah L., MD 09/19/19 [phone]

## 2019-09-17 NOTE — ED Notes (Signed)
Skin tear wrapped

## 2019-09-17 NOTE — ED Notes (Signed)
First Nurse Note: Pt to ED from [facility] for fall. Pt has skin tear on right forearm and family states that she hit her head. Denies LOC, denies use of blood thinners. Pt has hx/o dementia, has family waiting with her.

## 2019-09-17 NOTE — ED Notes (Signed)
Dsg to right arm removed, large ecchymotic area from above  right elbow to right forearm. Skin tear about 4 inches long.

## 2019-09-17 NOTE — ED Notes (Signed)
Pt alert to self and situation. Pt ambulating to toilet with assistance. Pt denies dizziness or headache.

## 2019-11-26 ENCOUNTER — Inpatient Hospital Stay
Admission: EM | Admit: 2019-11-26 | Discharge: 2019-11-29 | DRG: 522 | Disposition: A | Discharge status: Skilled Nursing Facility | Payer: Medicare Other | Attending: Internal Medicine | Admitting: Internal Medicine

## 2019-11-26 ENCOUNTER — Emergency Department: Payer: Medicare Other

## 2019-11-26 ENCOUNTER — Other Ambulatory Visit: Payer: Self-pay

## 2019-11-26 ENCOUNTER — Encounter: Payer: Self-pay | Admitting: Emergency Medicine

## 2019-11-26 DIAGNOSIS — S42009A Fracture of unspecified part of unspecified clavicle, initial encounter for closed fracture: Secondary | ICD-10-CM

## 2019-11-26 DIAGNOSIS — W1830XA Fall on same level, unspecified, initial encounter: Secondary | ICD-10-CM | POA: Diagnosis present

## 2019-11-26 DIAGNOSIS — Y92009 Unspecified place in unspecified non-institutional (private) residence as the place of occurrence of the external cause: Secondary | ICD-10-CM | POA: Diagnosis not present

## 2019-11-26 DIAGNOSIS — W19XXXA Unspecified fall, initial encounter: Secondary | ICD-10-CM

## 2019-11-26 DIAGNOSIS — Z8673 Personal history of transient ischemic attack (TIA), and cerebral infarction without residual deficits: Secondary | ICD-10-CM

## 2019-11-26 DIAGNOSIS — S72142A Displaced intertrochanteric fracture of left femur, initial encounter for closed fracture: Principal | ICD-10-CM | POA: Diagnosis present

## 2019-11-26 DIAGNOSIS — S72002A Fracture of unspecified part of neck of left femur, initial encounter for closed fracture: Secondary | ICD-10-CM | POA: Diagnosis not present

## 2019-11-26 DIAGNOSIS — I428 Other cardiomyopathies: Secondary | ICD-10-CM | POA: Diagnosis present

## 2019-11-26 DIAGNOSIS — S42009D Fracture of unspecified part of unspecified clavicle, subsequent encounter for fracture with routine healing: Secondary | ICD-10-CM | POA: Diagnosis not present

## 2019-11-26 DIAGNOSIS — Z96649 Presence of unspecified artificial hip joint: Secondary | ICD-10-CM

## 2019-11-26 DIAGNOSIS — F0280 Dementia in other diseases classified elsewhere without behavioral disturbance: Secondary | ICD-10-CM | POA: Diagnosis present

## 2019-11-26 DIAGNOSIS — Z20828 Contact with and (suspected) exposure to other viral communicable diseases: Secondary | ICD-10-CM | POA: Diagnosis present

## 2019-11-26 DIAGNOSIS — J449 Chronic obstructive pulmonary disease, unspecified: Secondary | ICD-10-CM | POA: Diagnosis present

## 2019-11-26 DIAGNOSIS — S72009A Fracture of unspecified part of neck of unspecified femur, initial encounter for closed fracture: Secondary | ICD-10-CM | POA: Diagnosis present

## 2019-11-26 DIAGNOSIS — I10 Essential (primary) hypertension: Secondary | ICD-10-CM | POA: Diagnosis present

## 2019-11-26 DIAGNOSIS — S72002K Fracture of unspecified part of neck of left femur, subsequent encounter for closed fracture with nonunion: Secondary | ICD-10-CM | POA: Diagnosis not present

## 2019-11-26 DIAGNOSIS — S42035A Nondisplaced fracture of lateral end of left clavicle, initial encounter for closed fracture: Secondary | ICD-10-CM | POA: Diagnosis present

## 2019-11-26 DIAGNOSIS — G309 Alzheimer's disease, unspecified: Secondary | ICD-10-CM | POA: Diagnosis present

## 2019-11-26 DIAGNOSIS — N179 Acute kidney failure, unspecified: Secondary | ICD-10-CM | POA: Diagnosis present

## 2019-11-26 DIAGNOSIS — F0390 Unspecified dementia without behavioral disturbance: Secondary | ICD-10-CM

## 2019-11-26 DIAGNOSIS — Z87891 Personal history of nicotine dependence: Secondary | ICD-10-CM

## 2019-11-26 DIAGNOSIS — Z8249 Family history of ischemic heart disease and other diseases of the circulatory system: Secondary | ICD-10-CM | POA: Diagnosis not present

## 2019-11-26 DIAGNOSIS — I739 Peripheral vascular disease, unspecified: Secondary | ICD-10-CM | POA: Diagnosis present

## 2019-11-26 DIAGNOSIS — T148XXA Other injury of unspecified body region, initial encounter: Secondary | ICD-10-CM

## 2019-11-26 HISTORY — DX: Dementia in other diseases classified elsewhere, unspecified severity, without behavioral disturbance, psychotic disturbance, mood disturbance, and anxiety: F02.80

## 2019-11-26 LAB — CBC WITH DIFFERENTIAL/PLATELET
Abs Immature Granulocytes: 0.07 10*3/uL (ref 0.00–0.07)
Basophils Absolute: 0.1 10*3/uL (ref 0.0–0.1)
Basophils Relative: 1 %
Eosinophils Absolute: 0.2 10*3/uL (ref 0.0–0.5)
Eosinophils Relative: 2 %
HCT: 41 % (ref 36.0–46.0)
Hemoglobin: 13.8 g/dL (ref 12.0–15.0)
Immature Granulocytes: 1 %
Lymphocytes Relative: 29 %
Lymphs Abs: 2.6 10*3/uL (ref 0.7–4.0)
MCH: 31.4 pg (ref 26.0–34.0)
MCHC: 33.7 g/dL (ref 30.0–36.0)
MCV: 93.2 fL (ref 80.0–100.0)
Monocytes Absolute: 0.8 10*3/uL (ref 0.1–1.0)
Monocytes Relative: 9 %
Neutro Abs: 5.3 10*3/uL (ref 1.7–7.7)
Neutrophils Relative %: 58 %
Platelets: 257 10*3/uL (ref 150–400)
RBC: 4.4 MIL/uL (ref 3.87–5.11)
RDW: 13.4 % (ref 11.5–15.5)
WBC: 9.3 10*3/uL (ref 4.0–10.5)
nRBC: 0 % (ref 0.0–0.2)

## 2019-11-26 LAB — BASIC METABOLIC PANEL
Anion gap: 11 (ref 5–15)
BUN: 29 mg/dL — ABNORMAL HIGH (ref 8–23)
CO2: 31 mmol/L (ref 22–32)
Calcium: 9.7 mg/dL (ref 8.9–10.3)
Chloride: 99 mmol/L (ref 98–111)
Creatinine, Ser: 1.09 mg/dL — ABNORMAL HIGH (ref 0.44–1.00)
GFR calc Af Amer: 51 mL/min — ABNORMAL LOW (ref >60)
GFR calc non Af Amer: 44 mL/min — ABNORMAL LOW (ref >60)
Glucose, Bld: 102 mg/dL — ABNORMAL HIGH (ref 70–99)
Potassium: 4.8 mmol/L (ref 3.5–5.1)
Sodium: 141 mmol/L (ref 135–145)

## 2019-11-26 LAB — PROTIME-INR
INR: 1.1 (ref 0.8–1.2)
Prothrombin Time: 14.5 seconds (ref 11.4–15.2)

## 2019-11-26 LAB — TYPE AND SCREEN
ABO/RH(D): O POS
Antibody Screen: NEGATIVE

## 2019-11-26 LAB — APTT: aPTT: 24 seconds (ref 24–36)

## 2019-11-26 LAB — RESPIRATORY PANEL BY RT PCR (FLU A&B, COVID)
Influenza A by PCR: NEGATIVE
Influenza B by PCR: NEGATIVE
SARS Coronavirus 2 by RT PCR: NEGATIVE

## 2019-11-26 MED ORDER — HYDROCODONE-ACETAMINOPHEN 5-325 MG PO TABS
1.0000 | ORAL_TABLET | Freq: Four times a day (QID) | ORAL | Status: DC | PRN
  Administered 2019-11-27: 06:00:00 2 via ORAL
  Filled 2019-11-26 (×2): qty 2

## 2019-11-26 MED ORDER — SODIUM CHLORIDE 0.9 % IV BOLUS
250.0000 mL | Freq: Once | INTRAVENOUS | Status: AC
  Administered 2019-11-27: 01:00:00 250 mL via INTRAVENOUS

## 2019-11-26 MED ORDER — ONDANSETRON HCL 4 MG/2ML IJ SOLN
4.0000 mg | Freq: Once | INTRAMUSCULAR | Status: AC
  Administered 2019-11-26: 4 mg via INTRAVENOUS
  Filled 2019-11-26: qty 2

## 2019-11-26 MED ORDER — CEFAZOLIN SODIUM-DEXTROSE 1-4 GM/50ML-% IV SOLN
1.0000 g | Freq: Once | INTRAVENOUS | Status: AC
  Administered 2019-11-27: 2 g via INTRAVENOUS
  Filled 2019-11-26 (×2): qty 50

## 2019-11-26 MED ORDER — SENNA 8.6 MG PO TABS: 1.0000 | ORAL_TABLET | Freq: Two times a day (BID) | ORAL | Status: DC

## 2019-11-26 MED ORDER — ACETAMINOPHEN 325 MG PO TABS: 650.0000 mg | ORAL_TABLET | Freq: Four times a day (QID) | ORAL | Status: DC | PRN

## 2019-11-26 MED ORDER — FENTANYL CITRATE (PF) 100 MCG/2ML IJ SOLN
50.0000 ug | Freq: Once | INTRAMUSCULAR | Status: AC
  Administered 2019-11-26: 22:00:00 50 ug via INTRAVENOUS
  Filled 2019-11-26: qty 2

## 2019-11-26 MED ORDER — FENTANYL CITRATE (PF) 100 MCG/2ML IJ SOLN
50.0000 ug | Freq: Once | INTRAMUSCULAR | Status: AC
  Administered 2019-11-26: 20:00:00 50 ug via INTRAVENOUS
  Filled 2019-11-26: qty 2

## 2019-11-26 MED ORDER — MORPHINE SULFATE (PF) 2 MG/ML IV SOLN
0.5000 mg | INTRAVENOUS | Status: DC | PRN
  Administered 2019-11-27: 01:00:00 0.5 mg via INTRAVENOUS
  Filled 2019-11-26: qty 1

## 2019-11-26 NOTE — H&P (Signed)
History and Physical    Angie GordonKatherine R Tschida ZHY:[id number] DOB: 09-06-1926 DOA: 11/26/2019  PCP: Danella PentonMiller, Mark F, MD  Patient coming from: Home, daughter at bedside  I have personally briefly reviewed patient's old medical [facility]  Chief Complaint: Fall   HPI: Angie GordonKatherine R Wilson is a 85 y.o. female with medical history significant of dementia,CVA,  nonischemic cardiomyopathy, PVD, intracerebral aneurysm, hypertension, and COPD who presents for unwitnessed fall. Daughter provides history due to her dementia. She thinks that patient hurried across the room too quickly and fell. Husband was in the other room and did not witness it.  Family was unable to help her off the floor so they called EMS.  Last fall was in Oct. Reports that patient was otherwise in her normal state of health.  Uses a walker at baseline.  No hx of dizziness and lightheadedness.   ED Course: She was afebrile and mildly hypertensive up to 150s over 46 on room air.  CBC showed no leukocytosis or anemia.  BMP showed glucose of 102, creatinine of 1.09. CT head and CT cervical spine showed no acute intracranial pathology.  No fractures of the cervical spine. Chest x-ray showed no acute cardiopulmonary disease and a nondisplaced fracture of the distal left clavicle Hip x-ray showed mildly displaced, noncomminuted left femoral neck fracture.  She was given 100 mcg of fentanyl and Zofran in the ED.  EDP consulted Ortho Dr. Allena KatzPatel who will plan for surgery tomorrow.  Review of Systems: Patient only complaining of generalized pain and lower back pain.  Unable to obtain a full ROS due to dementia.  Past Medical History:  Diagnosis Date  . Alzheimer's dementia (HCC)   . Glaucoma     Past Surgical History:  Procedure Laterality Date  . ABDOMINAL HYSTERECTOMY       reports that she has quit smoking. She has never used smokeless tobacco. She reports current alcohol use. No history on file for drug.  Family  hx Coronary Artery Disease - Mother  Myocardial Infarction-  Father    Prior to Admission medications   Medication Sig Start Date End Date Taking? Authorizing Provider  doxycycline (VIBRA-TABS) 100 MG tablet Take 1 tablet (100 mg total) by mouth 2 (two) times daily. 09/17/19   Cuthriell, Delorise RoyalsJonathan D, PA-C    Physical Exam: Vitals:   11/26/19 1834 11/26/19 1835  BP: (!) 150/46   Pulse: 61   Resp: 18   Temp: 98 F (36.7 C)   TempSrc: Oral   SpO2: 95%   Weight:  54.4 kg  Height:  5\' 4"  (1.626 m)    Constitutional: NAD, calm, comfortable thin, elderly pleasantly demented female laying flat in bed Vitals:   11/26/19 1834 11/26/19 1835  BP: (!) 150/46   Pulse: 61   Resp: 18   Temp: 98 F (36.7 C)   TempSrc: Oral   SpO2: 95%   Weight:  54.4 kg  Height:  5\' 4"  (1.626 m)   Eyes:  lids and conjunctivae normal ENMT: Mucous membranes are moist.  Neck: normal, supple Respiratory: clear to auscultation bilaterally, no wheezing, no crackles. Normal respiratory effort. Cardiovascular: Regular rate and rhythm, no murmurs / rubs / gallops. No extremity edema. 2+ pedal pulses. Abdomen: no tenderness,  Bowel sounds positive.  Musculoskeletal: no clubbing / cyanosis. No joint deformity upper and lower extremities.  Normal muscle tone.  Skin: no rashes, lesions, ulcers. No induration Neurologic: CN 2-12 grossly intact. Sensation intact.  Strong hand grip strength.  Able to lift right lower extremity against gravity but not against resistance secondary to pain.  Unable to lift left lower extremity due to pain. Psychiatric: Normal judgment and insight. Alert and oriented to self and place only. Normal mood.     Labs on Admission: I have personally reviewed following labs and imaging studies  CBC: Recent Labs  Lab 11/26/19 1849  WBC 9.3  NEUTROABS 5.3  HGB 13.8  HCT 41.0  MCV 93.2  PLT 257   Basic Metabolic Panel: Recent Labs  Lab 11/26/19 1849  NA 141  K 4.8  CL 99  CO2 31   GLUCOSE 102*  BUN 29*  CREATININE 1.09*  CALCIUM 9.7   GFR: Estimated Creatinine Clearance: 28.9 mL/min (A) (by C-G formula based on SCr of 1.09 mg/dL (H)). Liver Function Tests: No results for input(s): AST, ALT, ALKPHOS, BILITOT, PROT, ALBUMIN in the last 168 hours. No results for input(s): LIPASE, AMYLASE in the last 168 hours. No results for input(s): AMMONIA in the last 168 hours. Coagulation Profile: Recent Labs  Lab 11/26/19 1849  INR 1.1   Cardiac Enzymes: No results for input(s): CKTOTAL, CKMB, CKMBINDEX, TROPONINI in the last 168 hours. BNP (last 3 results) No results for input(s): PROBNP in the last 8760 hours. HbA1C: No results for input(s): HGBA1C in the last 72 hours. CBG: No results for input(s): GLUCAP in the last 168 hours. Lipid Profile: No results for input(s): CHOL, HDL, LDLCALC, TRIG, CHOLHDL, LDLDIRECT in the last 72 hours. Thyroid Function Tests: No results for input(s): TSH, T4TOTAL, FREET4, T3FREE, THYROIDAB in the last 72 hours. Anemia Panel: No results for input(s): VITAMINB12, FOLATE, FERRITIN, TIBC, IRON, RETICCTPCT in the last 72 hours. Urine analysis:    Component Value Date/Time   COLORURINE Yellow 03/31/2012 1734   APPEARANCEUR Hazy 03/31/2012 1734   LABSPEC 1.010 03/31/2012 1734   PHURINE 5.0 03/31/2012 1734   GLUCOSEU Negative 03/31/2012 1734   HGBUR Negative 03/31/2012 1734   BILIRUBINUR Negative 03/31/2012 1734   KETONESUR Negative 03/31/2012 1734   PROTEINUR Negative 03/31/2012 1734   NITRITE Negative 03/31/2012 1734   LEUKOCYTESUR Negative 03/31/2012 1734    Radiological Exams on Admission: DG Chest 1 View  Result Date: 11/26/2019 CLINICAL DATA:  Pt presents to the ED for a fall. Pt is c/o L shoulder and L hip pain. Pt has dementia at baseline. Denies hitting her head and LOC. EXAM: CHEST  1 VIEW COMPARISON:  Chest CT, 10/12/2015. FINDINGS: Cardiac silhouette is normal size.  No mediastinal hilar masses. Prominent  bronchovascular and interstitial lung markings. No evidence of pneumonia or pulmonary edema. No pleural effusion or pneumothorax. Skeletal structures are demineralized. Nondisplaced fracture of the distal left clavicle is noted. IMPRESSION: 1. No acute cardiopulmonary disease. 2. Nondisplaced fracture of the distal left clavicle Electronically Signed   By: Amie Portland M.D.   On: 11/26/2019 19:43   CT Head Wo Contrast  Result Date: 11/26/2019 CLINICAL DATA:  Fall EXAM: CT HEAD WITHOUT CONTRAST CT CERVICAL SPINE WITHOUT CONTRAST TECHNIQUE: Multidetector CT imaging of the head and cervical spine was performed following the standard protocol without intravenous contrast. Multiplanar CT image reconstructions of the cervical spine were also generated. COMPARISON:  09/17/2019 FINDINGS: CT HEAD FINDINGS Brain: No evidence of acute infarction, hemorrhage, hydrocephalus, extra-axial collection or mass lesion/mass effect. Extensive periventricular and deep white matter hypodensity. Mild global volume loss. Vascular: No hyperdense vessel or unexpected calcification. Skull: Normal. Negative for fracture or focal lesion. Sinuses/Orbits: No acute finding. Other: None. CT  CERVICAL SPINE FINDINGS Alignment: Degenerative straightening of the normal cervical lordosis. Skull base and vertebrae: No acute fracture. No primary bone lesion or focal pathologic process. Soft tissues and spinal canal: No prevertebral fluid or swelling. No visible canal hematoma. Disc levels: Moderate disc space height loss and osteophytosis of the lower cervical spine. Upper chest: Negative. Other: None. IMPRESSION: 1. No acute intracranial pathology. 2. Advanced small-vessel white matter disease. 3. No fracture or static subluxation of the cervical spine. Electronically Signed   By: Eddie Candle M.D.   On: 11/26/2019 20:06   CT Cervical Spine Wo Contrast  Result Date: 11/26/2019 CLINICAL DATA:  Fall EXAM: CT HEAD WITHOUT CONTRAST CT CERVICAL  SPINE WITHOUT CONTRAST TECHNIQUE: Multidetector CT imaging of the head and cervical spine was performed following the standard protocol without intravenous contrast. Multiplanar CT image reconstructions of the cervical spine were also generated. COMPARISON:  09/17/2019 FINDINGS: CT HEAD FINDINGS Brain: No evidence of acute infarction, hemorrhage, hydrocephalus, extra-axial collection or mass lesion/mass effect. Extensive periventricular and deep white matter hypodensity. Mild global volume loss. Vascular: No hyperdense vessel or unexpected calcification. Skull: Normal. Negative for fracture or focal lesion. Sinuses/Orbits: No acute finding. Other: None. CT CERVICAL SPINE FINDINGS Alignment: Degenerative straightening of the normal cervical lordosis. Skull base and vertebrae: No acute fracture. No primary bone lesion or focal pathologic process. Soft tissues and spinal canal: No prevertebral fluid or swelling. No visible canal hematoma. Disc levels: Moderate disc space height loss and osteophytosis of the lower cervical spine. Upper chest: Negative. Other: None. IMPRESSION: 1. No acute intracranial pathology. 2. Advanced small-vessel white matter disease. 3. No fracture or static subluxation of the cervical spine. Electronically Signed   By: Eddie Candle M.D.   On: 11/26/2019 20:06   DG Shoulder Left  Result Date: 11/26/2019 CLINICAL DATA:  Fall.  Left shoulder pain. EXAM: LEFT SHOULDER - 2+ VIEW COMPARISON:  11/13/2007 FINDINGS: Nondisplaced, non comminuted fracture of the distal clavicle. No other fractures. The glenohumeral and AC joints are normally spaced and aligned. No bone lesions.  Skeletal structures are demineralized. Soft tissues are unremarkable. IMPRESSION: 1. Nondisplaced, nonangulated fracture of the distal left clavicle. 2. No dislocation.  No other fractures. Electronically Signed   By: Lajean Manes M.D.   On: 11/26/2019 19:41   DG Hip Unilat W or Wo Pelvis 2-3 Views Left  Result Date:  11/26/2019 CLINICAL DATA:  Fall complaining of shoulder and left hip pain. EXAM: DG HIP (WITH OR WITHOUT PELVIS) 2-3V LEFT COMPARISON:  None. FINDINGS: Non comminuted fracture of the left mid femoral neck. The distal fracture component has displaced superiorly by approximately 2.5 cm. No other fractures. Hip joints, SI joints and pubic symphysis are normally aligned. Skeletal structures are diffusely demineralized. IMPRESSION: 1. Mildly displaced, non comminuted left femoral neck fracture. Electronically Signed   By: Lajean Manes M.D.   On: 11/26/2019 19:40    EKG: Independently reviewed.   Assessment/Plan  Left nondisplaced fracture of the distal clavicle - non-operable  - sling immobilizer   Displaced noncomminuted left femoral neck fracture -Ortho to operate in the morning -As needed Tylenol for mild pain, hydrocodone for moderate pain and morphine for severe pain -Frequent neurovascular checks  AKI -creatinine of 1.09 - give small 250cc bolus  Alzheimer's dementia -stable. baseline is oriented to self and place only. - continue galantamine  Nonischemic cardiomyopathy - continue aspirin  Hypertension - hold lisinopril-HCTZ due to AKI  COPD -continue bronchodilator  Medication reconciliation was not completed at the  time of admission.  Medication deemed necessary was ordered accordingly.  DVT prophylaxis:SCD Code Status:Full  Family Communication: Plan discussed with patient and daughter at bedside  disposition Plan: Home with at least 2 midnight stays  Consults called: Ortho Admission status: inpatient  Bernadett Milian T Keimari  DO [facility]   If 7PM-7AM, please contact night-coverage [email or web address] Password [facility]  11/26/2019, 9:36 PM

## 2019-11-26 NOTE — ED Provider Notes (Signed)
[facility] Emergency Department Provider Note  ____________________________________________   First MD Initiated Contact with Patient 11/26/19 1833     (approximate)  I have reviewed the triage vital signs and the nursing notes.   HISTORY  Chief Complaint Fall    HPI Angie Wilson is a 85 y.o. female with Alzheimer's dementia who lives at home with her husband and assist ambulate with a cane who presents with fall.  Sadly patient was try to get up out of the chair and had a mechanical fall onto her left hip.  She is having severe pain in that hip that is worse with moving, better at rest, constant.  Fall occurred about 30 minutes ago.  Patient's been unable to ambulate since the fall.  She does not think that she hit her head or loss consciousness.  She denies any other pain except for some pain in her left shoulder but not as severe as the hip pain          Past Medical History:  Diagnosis Date  . Alzheimer's dementia (HCC)   . Glaucoma     There are no problems to display for this patient.   Past Surgical History:  Procedure Laterality Date  . ABDOMINAL HYSTERECTOMY      Prior to Admission medications   Medication Sig Start Date End Date Taking? Authorizing Provider  doxycycline (VIBRA-TABS) 100 MG tablet Take 1 tablet (100 mg total) by mouth 2 (two) times daily. 09/17/19   Cuthriell, Delorise Royals, PA-C    Allergies Other, Penicillins, Prednisone, and Sulfa antibiotics  No family history on file.  Social History Social History   Tobacco Use  . Smoking status: Former Games developer  . Smokeless tobacco: Never Used  Substance Use Topics  . Alcohol use: Yes  . Drug use: Not on file      Review of Systems Constitutional: No fever/chills Eyes: No visual changes. ENT: No sore throat. Cardiovascular: Denies chest pain. Respiratory: Denies shortness of breath. Gastrointestinal: No abdominal pain.  No nausea, no vomiting.  No diarrhea.   No constipation. Genitourinary: Negative for dysuria. Musculoskeletal: Shoulder pain and hip pain Skin: Negative for rash. Neurological: Negative for headaches, focal weakness or numbness. All other ROS negative ____________________________________________   PHYSICAL EXAM:  VITAL SIGNS: Blood pressure (!) 150/46, pulse 61, temperature 98 F (36.7 C), temperature source Oral, resp. rate 18, height 5\' 4"  (1.626 m), weight 54.4 kg, SpO2 95 %.  Constitutional: Alert and oriented. GCS 15  Eyes: Conjunctivae are normal. EOMI. Head: Atraumatic. Nose: No congestion/rhinnorhea. Mouth/Throat: Mucous membranes are moist.   Neck: No stridor. Trachea Midline. FROM Cardiovascular: Normal rate, regular rhythm. Grossly normal heart sounds.  Good peripheral circulation. No chest wall tenderness Respiratory: Normal respiratory effort.  No retractions. Lungs CTAB. Gastrointestinal: Soft and nontender. No distention. No abdominal bruits.  Musculoskeletal:   RUE: No point tenderness, deformity or other signs of injury. Radial pulse intact. Neuro intact. Full ROM in joint. LUE: Pain in the left shoulder good distal pulse. RLE: No point tenderness, deformity or other signs of injury. DP pulse intact. Neuro intact. Full ROM in joints. LLE: Pain in the left hip with limited range of motion DP pulse intact. Neuro intact. Neurologic:  Normal speech and language. No gross focal neurologic deficits are appreciated.  Skin:  Skin is warm, dry and intact. No rash noted. Psychiatric: Mood and affect are normal. Speech and behavior are normal. GU: Deferred   ____________________________________________   LABS (all labs  ordered are listed, but only abnormal results are displayed)  Labs Reviewed  BASIC METABOLIC PANEL - Abnormal; Notable for the following components:      Result Value   Glucose, Bld 102 (*)    BUN 29 (*)    Creatinine, Ser 1.09 (*)    GFR calc non Af Amer 44 (*)    GFR calc Af Amer 51 (*)     All other components within normal limits  RESPIRATORY PANEL BY RT PCR (FLU A&B, COVID)  CBC WITH DIFFERENTIAL/PLATELET  PROTIME-INR  APTT  TYPE AND SCREEN   ____________________________________________   ED ECG REPORT I, Vanessa [place], the attending physician, personally viewed and interpreted this ECG.  EKG is difficult to interpret due to patient having some tremors but appears to be sinus, no ST elevation, possible left bundle branch block, normal intervals otherwise ____________________________________________  RADIOLOGY I, Vanessa [place], personally viewed and evaluated these images (plain radiographs) as part of my medical decision making, as well as reviewing the written report by the radiologist.  ED MD interpretation:  Xray consistent with femur fracture and clavicle fracture  Official radiology report(s): DG Chest 1 View  Result Date: 11/26/2019 CLINICAL DATA:  Pt presents to the ED for a fall. Pt is c/o L shoulder and L hip pain. Pt has dementia at baseline. Denies hitting her head and LOC. EXAM: CHEST  1 VIEW COMPARISON:  Chest CT, 10/12/2015. FINDINGS: Cardiac silhouette is normal size.  No mediastinal hilar masses. Prominent bronchovascular and interstitial lung markings. No evidence of pneumonia or pulmonary edema. No pleural effusion or pneumothorax. Skeletal structures are demineralized. Nondisplaced fracture of the distal left clavicle is noted. IMPRESSION: 1. No acute cardiopulmonary disease. 2. Nondisplaced fracture of the distal left clavicle Electronically Signed   By: Lajean Manes M.D.   On: 11/26/2019 19:43   CT Head Wo Contrast  Result Date: 11/26/2019 CLINICAL DATA:  Fall EXAM: CT HEAD WITHOUT CONTRAST CT CERVICAL SPINE WITHOUT CONTRAST TECHNIQUE: Multidetector CT imaging of the head and cervical spine was performed following the standard protocol without intravenous contrast. Multiplanar CT image reconstructions of the cervical spine were also generated.  COMPARISON:  09/17/2019 FINDINGS: CT HEAD FINDINGS Brain: No evidence of acute infarction, hemorrhage, hydrocephalus, extra-axial collection or mass lesion/mass effect. Extensive periventricular and deep white matter hypodensity. Mild global volume loss. Vascular: No hyperdense vessel or unexpected calcification. Skull: Normal. Negative for fracture or focal lesion. Sinuses/Orbits: No acute finding. Other: None. CT CERVICAL SPINE FINDINGS Alignment: Degenerative straightening of the normal cervical lordosis. Skull base and vertebrae: No acute fracture. No primary bone lesion or focal pathologic process. Soft tissues and spinal canal: No prevertebral fluid or swelling. No visible canal hematoma. Disc levels: Moderate disc space height loss and osteophytosis of the lower cervical spine. Upper chest: Negative. Other: None. IMPRESSION: 1. No acute intracranial pathology. 2. Advanced small-vessel white matter disease. 3. No fracture or static subluxation of the cervical spine. Electronically Signed   By: Eddie Candle M.D.   On: 11/26/2019 20:06   CT Cervical Spine Wo Contrast  Result Date: 11/26/2019 CLINICAL DATA:  Fall EXAM: CT HEAD WITHOUT CONTRAST CT CERVICAL SPINE WITHOUT CONTRAST TECHNIQUE: Multidetector CT imaging of the head and cervical spine was performed following the standard protocol without intravenous contrast. Multiplanar CT image reconstructions of the cervical spine were also generated. COMPARISON:  09/17/2019 FINDINGS: CT HEAD FINDINGS Brain: No evidence of acute infarction, hemorrhage, hydrocephalus, extra-axial collection or mass lesion/mass effect. Extensive periventricular and deep  white matter hypodensity. Mild global volume loss. Vascular: No hyperdense vessel or unexpected calcification. Skull: Normal. Negative for fracture or focal lesion. Sinuses/Orbits: No acute finding. Other: None. CT CERVICAL SPINE FINDINGS Alignment: Degenerative straightening of the normal cervical lordosis. Skull  base and vertebrae: No acute fracture. No primary bone lesion or focal pathologic process. Soft tissues and spinal canal: No prevertebral fluid or swelling. No visible canal hematoma. Disc levels: Moderate disc space height loss and osteophytosis of the lower cervical spine. Upper chest: Negative. Other: None. IMPRESSION: 1. No acute intracranial pathology. 2. Advanced small-vessel white matter disease. 3. No fracture or static subluxation of the cervical spine. Electronically Signed   By: Lauralyn Primes M.D.   On: 11/26/2019 20:06   DG Shoulder Left  Result Date: 11/26/2019 CLINICAL DATA:  Fall.  Left shoulder pain. EXAM: LEFT SHOULDER - 2+ VIEW COMPARISON:  11/13/2007 FINDINGS: Nondisplaced, non comminuted fracture of the distal clavicle. No other fractures. The glenohumeral and AC joints are normally spaced and aligned. No bone lesions.  Skeletal structures are demineralized. Soft tissues are unremarkable. IMPRESSION: 1. Nondisplaced, nonangulated fracture of the distal left clavicle. 2. No dislocation.  No other fractures. Electronically Signed   By: Amie Portland M.D.   On: 11/26/2019 19:41   DG Hip Unilat W or Wo Pelvis 2-3 Views Left  Result Date: 11/26/2019 CLINICAL DATA:  Fall complaining of shoulder and left hip pain. EXAM: DG HIP (WITH OR WITHOUT PELVIS) 2-3V LEFT COMPARISON:  None. FINDINGS: Non comminuted fracture of the left mid femoral neck. The distal fracture component has displaced superiorly by approximately 2.5 cm. No other fractures. Hip joints, SI joints and pubic symphysis are normally aligned. Skeletal structures are diffusely demineralized. IMPRESSION: 1. Mildly displaced, non comminuted left femoral neck fracture. Electronically Signed   By: Amie Portland M.D.   On: 11/26/2019 19:40    ____________________________________________   PROCEDURES  Procedure(s) performed (including Critical Care):  Procedures   ____________________________________________   INITIAL  IMPRESSION / ASSESSMENT AND PLAN / ED COURSE    TASHAE GLENNY was evaluated in Emergency Department on 11/26/2019 for the symptoms described in the history of present illness. She was evaluated in the context of the global COVID-19 pandemic, which necessitated consideration that the patient might be at risk for infection with the SARS-CoV-2 virus that causes COVID-19. Institutional protocols and algorithms that pertain to the evaluation of patients at risk for COVID-19 are in a state of rapid change based on information released by regulatory bodies including the CDC and federal and state organizations. These policies and algorithms were followed during the patient's care in the ED.    Patient presents with what sounds like mechanical fall.  Was concerning for hip fracture versus dislocation.  Will get x-ray to further evaluate.  Possible shoulder injury as well so get x-ray.  Get CT head given her dementia to make sure there is no signs of intracranial hemorrhage.  Get CT cervical to evaluate for cervical fracture.  No chest wall tenderness or abdominal tenderness.  Labs are reassuring.  CT head and CT cervical are negative  X-ray has a mildly displaced noncommuted left femoral neck fracture.  D/w with Dr. Allena Katz will plan to do surgery tomorrow.  Admit to hospital team  ____________________________________________   FINAL CLINICAL IMPRESSION(S) / ED DIAGNOSES   Final diagnoses:  Fall, initial encounter  Closed fracture of neck of left femur, initial encounter (HCC)      MEDICATIONS GIVEN DURING THIS VISIT:  Medications  fentaNYL (SUBLIMAZE) injection 50 mcg (has no administration in time range)  ceFAZolin (ANCEF) IVPB 1 g/50 mL premix (has no administration in time range)  fentaNYL (SUBLIMAZE) injection 50 mcg (50 mcg Intravenous Given 11/26/19 1934)  ondansetron (ZOFRAN) injection 4 mg (4 mg Intravenous Given 11/26/19 1932)     ED Discharge Orders    None        Note:  This document was prepared using Dragon voice recognition software and may include unintentional dictation errors.   Concha SeFunke, Bach Rocchi E, MD 11/26/19 2132

## 2019-11-26 NOTE — Progress Notes (Signed)
Full consult note to follow tomorrow.   Called by ED staff. Imaging reviewed.    I have talked to the patient's daughter, Laurence Aly, regarding the patient's injury and planned treatment consisting of L hip hemiarthroplasty.No surgical intervention for L clavicle fracture. She provided consent and we are in agreement to proceed after she discusses this with her family.   - Plan for surgery tomorrow, likely early afternoon.  - NPO after midnight - Hold anticoagulation - Admit to Hospitalist team.

## 2019-11-26 NOTE — ED Notes (Signed)
Pt taken to a room to swab for her resp panel.

## 2019-11-26 NOTE — ED Notes (Signed)
Pt to xray

## 2019-11-26 NOTE — ED Notes (Signed)
Pt to CT

## 2019-11-26 NOTE — ED Triage Notes (Addendum)
Pt via EMS from home. Pt presents to the ED for a fall. Pt is c/o L shoulder and L hip pain. Pt has dementia at baseline. Denies hitting her head and LOC. On arrival, pt is laying on her L side. Per EMS, pt will not roll on her back and off her side.

## 2019-11-27 ENCOUNTER — Inpatient Hospital Stay: Payer: Medicare Other

## 2019-11-27 ENCOUNTER — Other Ambulatory Visit: Payer: Self-pay

## 2019-11-27 ENCOUNTER — Encounter
Admission: EM | Disposition: A | Discharge status: Skilled Nursing Facility | Payer: Self-pay | Source: Home / Self Care | Attending: Internal Medicine

## 2019-11-27 ENCOUNTER — Encounter: Payer: Self-pay | Admitting: Family Medicine

## 2019-11-27 ENCOUNTER — Inpatient Hospital Stay: Payer: Medicare Other | Admitting: Anesthesiology

## 2019-11-27 DIAGNOSIS — S42009A Fracture of unspecified part of unspecified clavicle, initial encounter for closed fracture: Secondary | ICD-10-CM

## 2019-11-27 DIAGNOSIS — S72002A Fracture of unspecified part of neck of left femur, initial encounter for closed fracture: Secondary | ICD-10-CM

## 2019-11-27 DIAGNOSIS — S72002K Fracture of unspecified part of neck of left femur, subsequent encounter for closed fracture with nonunion: Secondary | ICD-10-CM

## 2019-11-27 DIAGNOSIS — N179 Acute kidney failure, unspecified: Secondary | ICD-10-CM

## 2019-11-27 DIAGNOSIS — I428 Other cardiomyopathies: Secondary | ICD-10-CM

## 2019-11-27 DIAGNOSIS — F0390 Unspecified dementia without behavioral disturbance: Secondary | ICD-10-CM

## 2019-11-27 DIAGNOSIS — J449 Chronic obstructive pulmonary disease, unspecified: Secondary | ICD-10-CM

## 2019-11-27 HISTORY — PX: HIP ARTHROPLASTY: SHX981

## 2019-11-27 LAB — SURGICAL PCR SCREEN
MRSA, PCR: NEGATIVE
Staphylococcus aureus: NEGATIVE

## 2019-11-27 SURGERY — HEMIARTHROPLASTY, HIP, DIRECT ANTERIOR APPROACH, FOR FRACTURE
Anesthesia: Spinal | Site: Hip | Laterality: Left

## 2019-11-27 MED ORDER — SODIUM CHLORIDE 0.9 % IR SOLN
Status: DC | PRN
  Administered 2019-11-27: 3000 mL
  Administered 2019-11-27: 1000 mL

## 2019-11-27 MED ORDER — PHENYLEPHRINE HCL (PRESSORS) 10 MG/ML IV SOLN
INTRAVENOUS | Status: DC | PRN
  Administered 2019-11-27 (×5): 200 ug via INTRAVENOUS

## 2019-11-27 MED ORDER — BUPIVACAINE HCL (PF) 0.5 % IJ SOLN
INTRAMUSCULAR | Status: DC | PRN
  Administered 2019-11-27: 3 mL via INTRATHECAL

## 2019-11-27 MED ORDER — TRAZODONE HCL 50 MG PO TABS
50.0000 mg | ORAL_TABLET | Freq: Every day | ORAL | Status: DC
  Administered 2019-11-27: 50 mg via ORAL
  Filled 2019-11-27: qty 1

## 2019-11-27 MED ORDER — CEFAZOLIN SODIUM-DEXTROSE 1-4 GM/50ML-% IV SOLN
1.0000 g | Freq: Four times a day (QID) | INTRAVENOUS | Status: AC
  Administered 2019-11-27 (×2): 1 g via INTRAVENOUS
  Filled 2019-11-27 (×2): qty 50

## 2019-11-27 MED ORDER — ONDANSETRON HCL 4 MG PO TABS: 4.0000 mg | ORAL_TABLET | Freq: Four times a day (QID) | ORAL | Status: DC | PRN

## 2019-11-27 MED ORDER — ONDANSETRON HCL 4 MG/2ML IJ SOLN: 4.0000 mg | Freq: Once | INTRAMUSCULAR | Status: DC | PRN

## 2019-11-27 MED ORDER — DIVALPROEX SODIUM 125 MG PO DR TAB
125.0000 mg | DELAYED_RELEASE_TABLET | Freq: Every day | ORAL | Status: DC
  Filled 2019-11-27: qty 1

## 2019-11-27 MED ORDER — GALANTAMINE HYDROBROMIDE 4 MG PO TABS
12.0000 mg | ORAL_TABLET | Freq: Two times a day (BID) | ORAL | Status: DC
  Filled 2019-11-27 (×2): qty 3

## 2019-11-27 MED ORDER — PROPOFOL 500 MG/50ML IV EMUL
INTRAVENOUS | Status: AC
  Filled 2019-11-27: qty 50

## 2019-11-27 MED ORDER — METOCLOPRAMIDE HCL 10 MG PO TABS: 5.0000 mg | ORAL_TABLET | Freq: Three times a day (TID) | ORAL | Status: DC | PRN

## 2019-11-27 MED ORDER — MOMETASONE FURO-FORMOTEROL FUM 100-5 MCG/ACT IN AERO
2.0000 | INHALATION_SPRAY | Freq: Two times a day (BID) | RESPIRATORY_TRACT | Status: DC
  Administered 2019-11-27: 2 via RESPIRATORY_TRACT
  Filled 2019-11-27: qty 8.8

## 2019-11-27 MED ORDER — TRAMADOL HCL 50 MG PO TABS: 50.0000 mg | ORAL_TABLET | Freq: Four times a day (QID) | ORAL | Status: DC | PRN

## 2019-11-27 MED ORDER — OXYCODONE HCL 5 MG PO TABS
2.5000 mg | ORAL_TABLET | ORAL | Status: DC | PRN
  Administered 2019-11-29: 5 mg via ORAL
  Filled 2019-11-27: qty 1

## 2019-11-27 MED ORDER — PHENOL 1.4 % MT LIQD
1.0000 | OROMUCOSAL | Status: DC | PRN
  Filled 2019-11-27: qty 177

## 2019-11-27 MED ORDER — ENOXAPARIN SODIUM 30 MG/0.3ML ~~LOC~~ SOLN: 30.0000 mg | SUBCUTANEOUS | Status: DC

## 2019-11-27 MED ORDER — ONDANSETRON HCL 4 MG/2ML IJ SOLN: 4.0000 mg | Freq: Four times a day (QID) | INTRAMUSCULAR | Status: DC | PRN

## 2019-11-27 MED ORDER — ENOXAPARIN SODIUM 40 MG/0.4ML ~~LOC~~ SOLN: 40.0000 mg | SUBCUTANEOUS | Status: DC

## 2019-11-27 MED ORDER — METOCLOPRAMIDE HCL 5 MG/ML IJ SOLN: 5.0000 mg | Freq: Three times a day (TID) | INTRAMUSCULAR | Status: DC | PRN

## 2019-11-27 MED ORDER — TRANEXAMIC ACID 1000 MG/10ML IV SOLN
INTRAVENOUS | Status: AC
  Filled 2019-11-27: qty 10

## 2019-11-27 MED ORDER — TRANEXAMIC ACID-NACL 1000-0.7 MG/100ML-% IV SOLN
1000.0000 mg | Freq: Once | INTRAVENOUS | Status: AC
  Administered 2019-11-27: 13:00:00 1000 mg via INTRAVENOUS

## 2019-11-27 MED ORDER — BISACODYL 10 MG RE SUPP
10.0000 mg | Freq: Every day | RECTAL | Status: DC | PRN
  Administered 2019-11-29: 10 mg via RECTAL
  Filled 2019-11-27: qty 1

## 2019-11-27 MED ORDER — TRAZODONE HCL 50 MG PO TABS
50.0000 mg | ORAL_TABLET | Freq: Every evening | ORAL | Status: DC | PRN
  Administered 2019-11-27: 50 mg via ORAL
  Filled 2019-11-27: qty 1

## 2019-11-27 MED ORDER — SODIUM CHLORIDE 0.9 % IV SOLN
INTRAVENOUS | Status: DC | PRN
  Administered 2019-11-27: 50 ug/min via INTRAVENOUS

## 2019-11-27 MED ORDER — FENTANYL CITRATE (PF) 100 MCG/2ML IJ SOLN: 25.0000 ug | INTRAMUSCULAR | Status: DC | PRN

## 2019-11-27 MED ORDER — PHENYLEPHRINE HCL (PRESSORS) 10 MG/ML IV SOLN
INTRAVENOUS | Status: AC
  Filled 2019-11-27: qty 1

## 2019-11-27 MED ORDER — PROPOFOL 500 MG/50ML IV EMUL
INTRAVENOUS | Status: DC | PRN
  Administered 2019-11-27: 40 ug/kg/min via INTRAVENOUS

## 2019-11-27 MED ORDER — EPHEDRINE SULFATE 50 MG/ML IJ SOLN
INTRAMUSCULAR | Status: DC | PRN
  Administered 2019-11-27: 50 mg via INTRAMUSCULAR

## 2019-11-27 MED ORDER — BUPIVACAINE LIPOSOME 1.3 % IJ SUSP
INTRAMUSCULAR | Status: DC | PRN
  Administered 2019-11-27: 50 mL

## 2019-11-27 MED ORDER — ACETAMINOPHEN 500 MG PO TABS
1000.0000 mg | ORAL_TABLET | Freq: Three times a day (TID) | ORAL | Status: AC
  Administered 2019-11-27 – 2019-11-28 (×4): 1000 mg via ORAL
  Filled 2019-11-27 (×4): qty 2

## 2019-11-27 MED ORDER — ENOXAPARIN SODIUM 40 MG/0.4ML ~~LOC~~ SOLN
40.0000 mg | SUBCUTANEOUS | Status: DC
  Administered 2019-11-28: 40 mg via SUBCUTANEOUS
  Filled 2019-11-27: qty 0.4

## 2019-11-27 MED ORDER — LACTATED RINGERS IV SOLN
INTRAVENOUS | Status: DC
  Administered 2019-11-27 (×2): via INTRAVENOUS

## 2019-11-27 MED ORDER — ACETAMINOPHEN 160 MG/5ML PO SOLN
325.0000 mg | ORAL | Status: DC | PRN
  Filled 2019-11-27: qty 20.3

## 2019-11-27 MED ORDER — KETAMINE HCL 50 MG/ML IJ SOLN
INTRAMUSCULAR | Status: AC
  Filled 2019-11-27: qty 10

## 2019-11-27 MED ORDER — STERILE WATER FOR INJECTION IJ SOLN
INTRAMUSCULAR | Status: AC
  Filled 2019-11-27: qty 10

## 2019-11-27 MED ORDER — ASPIRIN EC 81 MG PO TBEC: 81.0000 mg | DELAYED_RELEASE_TABLET | Freq: Every day | ORAL | Status: DC

## 2019-11-27 MED ORDER — MENTHOL 3 MG MT LOZG
1.0000 | LOZENGE | OROMUCOSAL | Status: DC | PRN
  Filled 2019-11-27: qty 9

## 2019-11-27 MED ORDER — SODIUM CHLORIDE 0.9 % IV SOLN
INTRAVENOUS | Status: DC
  Administered 2019-11-27: 16:00:00 via INTRAVENOUS

## 2019-11-27 MED ORDER — KETAMINE HCL 50 MG/ML IJ SOLN
INTRAMUSCULAR | Status: DC | PRN
  Administered 2019-11-27: 75 mg via INTRAVENOUS

## 2019-11-27 MED ORDER — POLYETHYLENE GLYCOL 3350 17 G PO PACK: 17.0000 g | PACK | Freq: Every day | ORAL | Status: DC

## 2019-11-27 MED ORDER — ACETAMINOPHEN 325 MG PO TABS: 325.0000 mg | ORAL_TABLET | ORAL | Status: DC | PRN

## 2019-11-27 MED ORDER — OXYCODONE HCL 5 MG PO TABS
5.0000 mg | ORAL_TABLET | ORAL | Status: DC | PRN
  Administered 2019-11-27: 20:00:00 10 mg via ORAL
  Filled 2019-11-27: qty 2

## 2019-11-27 MED ORDER — TRANEXAMIC ACID-NACL 1000-0.7 MG/100ML-% IV SOLN
INTRAVENOUS | Status: DC | PRN
  Administered 2019-11-27: 1000 mg via INTRAVENOUS

## 2019-11-27 MED ORDER — SENNOSIDES-DOCUSATE SODIUM 8.6-50 MG PO TABS
1.0000 | ORAL_TABLET | Freq: Every evening | ORAL | Status: DC | PRN
  Administered 2019-11-27: 1 via ORAL
  Filled 2019-11-27: qty 1

## 2019-11-27 MED ORDER — HYDROMORPHONE HCL 1 MG/ML IJ SOLN: 0.2000 mg | INTRAMUSCULAR | Status: DC | PRN

## 2019-11-27 MED ORDER — DOCUSATE SODIUM 100 MG PO CAPS
100.0000 mg | ORAL_CAPSULE | Freq: Two times a day (BID) | ORAL | Status: DC
  Administered 2019-11-27 – 2019-11-29 (×4): 100 mg via ORAL
  Filled 2019-11-27 (×4): qty 1

## 2019-11-27 SURGICAL SUPPLY — 70 items
BLADE SAGITTAL AGGR TOOTH XLG (BLADE) ×2 IMPLANT
BLADE SAW SGTL 13X75X1.27 (BLADE) ×3 IMPLANT
BLADE SURG SZ10 CARB STEEL (BLADE) ×3 IMPLANT
BNDG COHESIVE 4X5 TAN STRL (GAUZE/BANDAGES/DRESSINGS) ×3 IMPLANT
CANISTER SUCT 1200ML W/VALVE (MISCELLANEOUS) ×3 IMPLANT
CANISTER SUCT 3000ML PPV (MISCELLANEOUS) ×6 IMPLANT
CHLORAPREP W/TINT 26 (MISCELLANEOUS) ×3 IMPLANT
COVER BACK TABLE REUSABLE LG (DRAPES) ×3 IMPLANT
COVER WAND RF STERILE (DRAPES) ×3 IMPLANT
DERMABOND ADVANCED (GAUZE/BANDAGES/DRESSINGS) ×2
DERMABOND ADVANCED .7 DNX12 (GAUZE/BANDAGES/DRESSINGS) ×1 IMPLANT
DRAPE 3/4 80X56 (DRAPES) ×9 IMPLANT
DRAPE IMP U-DRAPE 54X76 (DRAPES) ×2 IMPLANT
DRAPE INCISE IOBAN 66X60 STRL (DRAPES) ×3 IMPLANT
DRAPE SPLIT 6X30 W/TAPE (DRAPES) ×3 IMPLANT
DRAPE SURG 17X11 SM STRL (DRAPES) ×3 IMPLANT
DRAPE XRAY CASSETTE 23X24 (DRAPES) ×2 IMPLANT
DRSG OPSITE POSTOP 4X10 (GAUZE/BANDAGES/DRESSINGS) ×3 IMPLANT
DRSG OPSITE POSTOP 4X8 (GAUZE/BANDAGES/DRESSINGS) ×5 IMPLANT
ELECT BLADE 6.5 EXT (BLADE) ×3 IMPLANT
ELECT CAUTERY BLADE 6.4 (BLADE) ×3 IMPLANT
ELECT REM PT RETURN 9FT ADLT (ELECTROSURGICAL) ×3
ELECTRODE REM PT RTRN 9FT ADLT (ELECTROSURGICAL) ×1 IMPLANT
GAUZE SPONGE 4X4 12PLY STRL (GAUZE/BANDAGES/DRESSINGS) ×3 IMPLANT
GAUZE XEROFORM 1X8 LF (GAUZE/BANDAGES/DRESSINGS) ×3 IMPLANT
GLOVE BIOGEL PI IND STRL 8 (GLOVE) ×2 IMPLANT
GLOVE BIOGEL PI INDICATOR 8 (GLOVE) ×4
GLOVE SURG ORTHO 8.0 STRL STRW (GLOVE) ×6 IMPLANT
GOWN STRL REUS W/ TWL LRG LVL3 (GOWN DISPOSABLE) ×1 IMPLANT
GOWN STRL REUS W/ TWL XL LVL3 (GOWN DISPOSABLE) ×1 IMPLANT
GOWN STRL REUS W/TWL LRG LVL3 (GOWN DISPOSABLE) ×2
GOWN STRL REUS W/TWL XL LVL3 (GOWN DISPOSABLE) ×2
HEAD MODULAR ENDO (Orthopedic Implant) ×2 IMPLANT
HEAD UNPLR 45XMDLR STRL HIP (Orthopedic Implant) IMPLANT
HEMOVAC 400ML (MISCELLANEOUS)
KIT DRAIN HEMOVAC JP 7FR 400ML (MISCELLANEOUS) IMPLANT
KIT TURNOVER KIT A (KITS) ×3 IMPLANT
NDL FILTER BLUNT 18X1 1/2 (NEEDLE) ×1 IMPLANT
NDL MAYO CATGUT SZ4 TPR NDL (NEEDLE) ×1 IMPLANT
NDL SAFETY ECLIPSE 18X1.5 (NEEDLE) ×1 IMPLANT
NEEDLE FILTER BLUNT 18X 1/2SAF (NEEDLE) ×2
NEEDLE FILTER BLUNT 18X1 1/2 (NEEDLE) ×1 IMPLANT
NEEDLE HYPO 18GX1.5 SHARP (NEEDLE) ×2
NEEDLE HYPO 22GX1.5 SAFETY (NEEDLE) ×5 IMPLANT
NEEDLE MAYO CATGUT SZ4 (NEEDLE) ×3 IMPLANT
NEPTUNE MANIFOLD (MISCELLANEOUS) ×3 IMPLANT
NS IRRIG 1000ML POUR BTL (IV SOLUTION) ×3 IMPLANT
PACK HIP PROSTHESIS (MISCELLANEOUS) ×3 IMPLANT
PENCIL SMOKE ULTRAEVAC 22 CON (MISCELLANEOUS) ×3 IMPLANT
PILLOW ABDUCTION FOAM SM (MISCELLANEOUS) ×5 IMPLANT
PULSAVAC PLUS IRRIG FAN TIP (DISPOSABLE) ×3
RETRIEVER SUT HEWSON (MISCELLANEOUS) IMPLANT
SLEEVE UNITRAX V40 STD (Orthopedic Implant) ×2 IMPLANT
SOL .9 NS 3000ML IRR  AL (IV SOLUTION) ×2
SOL .9 NS 3000ML IRR UROMATIC (IV SOLUTION) ×1 IMPLANT
STAPLER SKIN PROX 35W (STAPLE) ×3 IMPLANT
STEM HIP 4 127DEG (Stem) ×2 IMPLANT
SUT ETHIBOND #5 BRAIDED 30INL (SUTURE) ×3 IMPLANT
SUT MNCRL 4-0 (SUTURE) ×2
SUT MNCRL 4-0 27XMFL (SUTURE) ×1
SUT VIC AB 0 CT1 36 (SUTURE) ×5 IMPLANT
SUT VIC AB 2-0 CT2 27 (SUTURE) ×6 IMPLANT
SUTURE MNCRL 4-0 27XMF (SUTURE) ×1 IMPLANT
SYR 20ML LL LF (SYRINGE) ×3 IMPLANT
SYR 50ML LL SCALE MARK (SYRINGE) ×3 IMPLANT
TAPE MICROFOAM 4IN (TAPE) ×3 IMPLANT
TAPE TRANSPORE STRL 2 31045 (GAUZE/BANDAGES/DRESSINGS) ×3 IMPLANT
TIP BRUSH PULSAVAC PLUS 24.33 (MISCELLANEOUS) ×3 IMPLANT
TIP FAN IRRIG PULSAVAC PLUS (DISPOSABLE) ×1 IMPLANT
TUBE SUCT KAM VAC (TUBING) ×3 IMPLANT

## 2019-11-27 NOTE — Anesthesia Procedure Notes (Signed)
Spinal  Patient location during procedure: OR End time: 11/27/2019 10:40 AM Staffing Anesthesiologist: Alphonsus Sias, MD Preanesthetic Checklist Completed: patient identified, IV checked, site marked, risks and benefits discussed, surgical consent, monitors and equipment checked, pre-op evaluation and timeout performed Spinal Block Patient position: sitting Prep: ChloraPrep Patient monitoring: heart rate, continuous pulse ox, blood pressure and cardiac monitor Approach: right paramedian Location: L2-3 Injection technique: single-shot Needle Needle type: Pencil-Tip  Needle gauge: 24 G Needle length: 9 cm Assessment Sensory level: T8 Additional Notes Negative paresthesia. Negative blood return. Positive free-flowing CSF. Expiration date of kit checked and confirmed. Patient tolerated procedure well, without complications.

## 2019-11-27 NOTE — Transfer of Care (Signed)
Immediate Anesthesia Transfer of Care Note  Patient: Angie Wilson  Procedure(s) Performed: ARTHROPLASTY BIPOLAR HIP (HEMIARTHROPLASTY) (Left Hip)  Patient Location: PACU  Anesthesia Type:Spinal  Level of Consciousness: sedated  Airway & Oxygen Therapy: Patient Spontanous Breathing and Patient connected to nasal cannula oxygen  Post-op Assessment: Report given to RN and Post -op Vital signs reviewed and stable  Post vital signs: Reviewed and stable  Last Vitals:  Vitals Value Taken Time  BP    Temp    Pulse    Resp    SpO2      Last Pain:  Vitals:   11/27/19 0806  TempSrc: Oral  PainSc:          Complications: No apparent anesthesia complications

## 2019-11-27 NOTE — Consult Note (Addendum)
ORTHOPAEDIC CONSULTATION  REQUESTING PHYSICIAN: Lynn Ito, MD  Chief Complaint:   L hip pain  History of Present Illness: Patient has a significant history of dementia.  History obtained from the patient's daughter, discussion with other medical staff, and review of her chart.  Angie Wilson is a 85 y.o. femalewho had a fall earlier yesterday.  The patient noted immediate hip pain and inability to ambulate.  She lives at home with her husband.  The patient ambulates with a walker and/or cane at baseline.  Pain is described as sharp at its worst and a dull ache at its best.  Pain is rated a 10 out of 10 in severity.  Pain is improved with rest and immobilization.  Pain is worse with any sort of movement.  X-rays in the emergency department show a left intertrochanteric hip fracture as well as a left distal clavicle fracture.  Past Medical History:  Diagnosis Date  . Alzheimer's dementia (HCC)   . Glaucoma    Past Surgical History:  Procedure Laterality Date  . ABDOMINAL HYSTERECTOMY     Social History   Socioeconomic History  . Marital status: Married    Spouse name: Not on file  . Number of children: Not on file  . Years of education: Not on file  . Highest education level: Not on file  Occupational History  . Not on file  Tobacco Use  . Smoking status: Former Smoker    Quit date: 11/27/1987    Years since quitting: 32.0  . Smokeless tobacco: Never Used  Substance and Sexual Activity  . Alcohol use: Not Currently  . Drug use: Never  . Sexual activity: Not Currently  Other Topics Concern  . Not on file  Social History Narrative  . Not on file   Social Determinants of Health   Financial Resource Strain:   . Difficulty of Paying Living Expenses: Not on file  Food Insecurity:   . Worried About Programme researcher, broadcasting/film/video in the Last Year: Not on file  . Ran Out of Food in the Last Year: Not on file   Transportation Needs:   . Lack of Transportation (Medical): Not on file  . Lack of Transportation (Non-Medical): Not on file  Physical Activity:   . Days of Exercise per Week: Not on file  . Minutes of Exercise per Session: Not on file  Stress:   . Feeling of Stress : Not on file  Social Connections:   . Frequency of Communication with Friends and Family: Not on file  . Frequency of Social Gatherings with Friends and Family: Not on file  . Attends Religious Services: Not on file  . Active Member of Clubs or Organizations: Not on file  . Attends Banker Meetings: Not on file  . Marital Status: Not on file   History reviewed. No pertinent family history. Allergies  Allergen Reactions  . Other     Other reaction(s): Other (See Comments), Unknown Dust and Mold Dust and Mold   . Penicillins     Other reaction(s): Other (See Comments)  . Prednisone     Other reaction(s): Other (See Comments), Other (See Comments) Not allergic, but does not like the way she feels at increased dosages. Lower dose works well for pt. Not allergic but does not like the way she feels but it works well   . Sulfa Antibiotics     Other reaction(s): Unknown   Prior to Admission medications   Medication Sig Start Date  End Date Taking? Authorizing Provider  doxycycline (VIBRA-TABS) 100 MG tablet Take 1 tablet (100 mg total) by mouth 2 (two) times daily. Patient not taking: Reported on 11/27/2019 09/17/19   Lanette Hampshire   Recent Labs    11/26/19 1849  WBC 9.3  HGB 13.8  HCT 41.0  PLT 257  K 4.8  CL 99  CO2 31  BUN 29*  CREATININE 1.09*  GLUCOSE 102*  CALCIUM 9.7  INR 1.1   DG Chest 1 View  Result Date: 11/26/2019 CLINICAL DATA:  Pt presents to the ED for a fall. Pt is c/o L shoulder and L hip pain. Pt has dementia at baseline. Denies hitting her head and LOC. EXAM: CHEST  1 VIEW COMPARISON:  Chest CT, 10/12/2015. FINDINGS: Cardiac silhouette is normal size.  No  mediastinal hilar masses. Prominent bronchovascular and interstitial lung markings. No evidence of pneumonia or pulmonary edema. No pleural effusion or pneumothorax. Skeletal structures are demineralized. Nondisplaced fracture of the distal left clavicle is noted. IMPRESSION: 1. No acute cardiopulmonary disease. 2. Nondisplaced fracture of the distal left clavicle Electronically Signed   By: Amie Portland M.D.   On: 11/26/2019 19:43   CT Head Wo Contrast  Result Date: 11/26/2019 CLINICAL DATA:  Fall EXAM: CT HEAD WITHOUT CONTRAST CT CERVICAL SPINE WITHOUT CONTRAST TECHNIQUE: Multidetector CT imaging of the head and cervical spine was performed following the standard protocol without intravenous contrast. Multiplanar CT image reconstructions of the cervical spine were also generated. COMPARISON:  09/17/2019 FINDINGS: CT HEAD FINDINGS Brain: No evidence of acute infarction, hemorrhage, hydrocephalus, extra-axial collection or mass lesion/mass effect. Extensive periventricular and deep white matter hypodensity. Mild global volume loss. Vascular: No hyperdense vessel or unexpected calcification. Skull: Normal. Negative for fracture or focal lesion. Sinuses/Orbits: No acute finding. Other: None. CT CERVICAL SPINE FINDINGS Alignment: Degenerative straightening of the normal cervical lordosis. Skull base and vertebrae: No acute fracture. No primary bone lesion or focal pathologic process. Soft tissues and spinal canal: No prevertebral fluid or swelling. No visible canal hematoma. Disc levels: Moderate disc space height loss and osteophytosis of the lower cervical spine. Upper chest: Negative. Other: None. IMPRESSION: 1. No acute intracranial pathology. 2. Advanced small-vessel white matter disease. 3. No fracture or static subluxation of the cervical spine. Electronically Signed   By: Lauralyn Primes M.D.   On: 11/26/2019 20:06   CT Cervical Spine Wo Contrast  Result Date: 11/26/2019 CLINICAL DATA:  Fall EXAM: CT  HEAD WITHOUT CONTRAST CT CERVICAL SPINE WITHOUT CONTRAST TECHNIQUE: Multidetector CT imaging of the head and cervical spine was performed following the standard protocol without intravenous contrast. Multiplanar CT image reconstructions of the cervical spine were also generated. COMPARISON:  09/17/2019 FINDINGS: CT HEAD FINDINGS Brain: No evidence of acute infarction, hemorrhage, hydrocephalus, extra-axial collection or mass lesion/mass effect. Extensive periventricular and deep white matter hypodensity. Mild global volume loss. Vascular: No hyperdense vessel or unexpected calcification. Skull: Normal. Negative for fracture or focal lesion. Sinuses/Orbits: No acute finding. Other: None. CT CERVICAL SPINE FINDINGS Alignment: Degenerative straightening of the normal cervical lordosis. Skull base and vertebrae: No acute fracture. No primary bone lesion or focal pathologic process. Soft tissues and spinal canal: No prevertebral fluid or swelling. No visible canal hematoma. Disc levels: Moderate disc space height loss and osteophytosis of the lower cervical spine. Upper chest: Negative. Other: None. IMPRESSION: 1. No acute intracranial pathology. 2. Advanced small-vessel white matter disease. 3. No fracture or static subluxation of the cervical spine. Electronically Signed  By: Eddie Candle M.D.   On: 11/26/2019 20:06   DG Shoulder Left  Result Date: 11/26/2019 CLINICAL DATA:  Fall.  Left shoulder pain. EXAM: LEFT SHOULDER - 2+ VIEW COMPARISON:  11/13/2007 FINDINGS: Nondisplaced, non comminuted fracture of the distal clavicle. No other fractures. The glenohumeral and AC joints are normally spaced and aligned. No bone lesions.  Skeletal structures are demineralized. Soft tissues are unremarkable. IMPRESSION: 1. Nondisplaced, nonangulated fracture of the distal left clavicle. 2. No dislocation.  No other fractures. Electronically Signed   By: Lajean Manes M.D.   On: 11/26/2019 19:41   DG Hip Unilat W or Wo Pelvis  2-3 Views Left  Result Date: 11/26/2019 CLINICAL DATA:  Fall complaining of shoulder and left hip pain. EXAM: DG HIP (WITH OR WITHOUT PELVIS) 2-3V LEFT COMPARISON:  None. FINDINGS: Non comminuted fracture of the left mid femoral neck. The distal fracture component has displaced superiorly by approximately 2.5 cm. No other fractures. Hip joints, SI joints and pubic symphysis are normally aligned. Skeletal structures are diffusely demineralized. IMPRESSION: 1. Mildly displaced, non comminuted left femoral neck fracture. Electronically Signed   By: Lajean Manes M.D.   On: 11/26/2019 19:40     Positive ROS: All other systems have been reviewed and were otherwise negative with the exception of those mentioned in the HPI and as above.  Physical Exam: BP (!) 99/59 (BP Location: Right Arm)   Pulse 69   Temp 98.1 F (36.7 C) (Oral)   Resp 18   Ht 5\' 4"  (1.626 m)   Wt 52.2 kg   SpO2 98%   BMI 19.76 kg/m  General:  Alert, no acute distress, A&O x1 to name only Psychiatric:  Patient is not competent for consent, nonagitated Cardiovascular:  No pedal edema, regular rate and rhythm Respiratory:  No wheezing, non-labored breathing GI:  Abdomen is soft and non-tender Skin:  No lesions in the area of chief complaint, no erythema Neurologic:  Sensation intact distally, CN grossly intact Lymphatic:  No axillary or cervical lymphadenopathy  Orthopedic Exam:  LLE: + DF/PF/EHL SILT grossly over foot Foot wwp +Log roll/axial load  LUE: +ain/pin/u motor SILT r/u/m/ax +rad pulse RoM not tested  X-rays:  As above: L displaced femoral neck fracture and left stable distal clavicle fracture with minimal displacement  Assessment/Plan: ARETA TERWILLIGER is a 85 y.o. female with a L displaced femoral neck fracture and stable left distal clavicle fracture   1. I discussed the various treatment options including both surgical and non-surgical management of her fracture with the patient's daughter  (medical PoA). We discussed the high risk of perioperative complications due to patient's age, dementia, and other co-morbidities. After discussion of risks, benefits, and alternatives to surgery, the family and patient were in agreement to proceed with surgery. The goals of surgery would be to provide adequate pain relief and allow for mobilization. Plan for surgery is L hip hemiarthroplasty today, 11/27/2019.  We will plan to treat the left distal clavicle fracture nonoperatively with nonweightbearing and sling on left upper extremity. 2. NPO until OR 3. Hold anticoagulation in advance of OR          Leim Fabry   11/27/2019 9:44 AM

## 2019-11-27 NOTE — Op Note (Addendum)
DATE OF SURGERY: 11/27/2019  PREOPERATIVE DIAGNOSIS:  1. Left femoral neck fracture 2.  Left distal clavicle fracture  POSTOPERATIVE DIAGNOSIS:  1. Left femoral neck fracture 2.  Left distal clavicle fracture  PROCEDURE:  1. Left hip hemiarthroplasty 2.  Closed management of left distal clavicle fracture  SURGEON: Cato Mulligan, MD  ANESTHESIA: spinal  EBL: 100 cc  COMPONENTS:  [employer] - Accolade II Siz  Stem [employer] - Unitrax 41mm head with neutral neck   INDICATIONS: Angie Wilson is a 85 y.o. female with a history of dementia who sustained a displaced femoral neck fracture after a fall. Risks and benefits of hip hemiarthroplasty were explained to the patient's family. Risks include but are not limited to bleeding, infection, injury to tissues, nerves, vessels, periprosthetic infection, dislocation, limb length discrepancy and risks of anesthesia.  Additionally, the patient has a stable left distal clavicle fracture.  Given this fracture pattern, we will plan to treat this nonoperatively.  The patient's daughter understands these risks, has completed an informed consent and wishes to proceed.   PROCEDURE:  The patient was identified in the preoperative holding area and the operative extremity was marked.  The patient was then transferred to the operating room suite and mobilized from the hospital gurney to the operating room table. Anesthesia was administered without complication. The patient was then transitioned to a lateral position.  All bony prominences were padded per protocol.  An axillary roll was placed.  Careful attention was paid to the contralateral side peroneal nerve, which was free from pressure with use of appropriate padding and blankets. A time-out was performed to confirm the patient's identity and the correct laterality of surgery. The patient was then prepped and draped in the usual sterile fashion. Appropriate pre-operative antibiotics were administered.  Tranexamic acid was administered preoperatively.    An incision that centered on the posterior tip of the greater trochanter with a posterior curve was made. Dissection was carried down through the subcutaneous tissue.  Careful attention was made to maintain hemostasis using electrocautery.  Dissection brought [place] to the level of the deep fascia where the gluteus maximus muscle and proximal portion of the IT band were identified.  The proximal region of the IT band was incised in linear fashion and this incision was extended proximally in a curvilinear fashion to split the gluteus maximus muscle parallel to its fibers to minimize bleeding.  This was accomplished using a combination of bovie electrocautery as well as blunt dissection.  The trochanteric bursa was then visualized and dissected from anterior to posterior. A blunt homan retractor was placed beneath the abductors. The piriformis tendon and short external rotators were visualized. Bovie electrocautery was used to cut these with the capsule as one L-shaped flap. This was tagged at the corner with #5 Ethibond. At this point, the femoral neck fracture was visualized. An oscillating saw was used to make a new neck cut approximately 62mm above the lesser tuberosity with the use of a neck cut guide. The head was then freed from its remaining soft tissue attachments and measured. The head trial was then inserted into the acetabulum and the appropriate sized head was selected.    We then turned our attention to preparing the femoral canal. First, a box cut was performed utilizing the box osteotome. A canal finder was inserted by hand and sequential broaching was then performed. The calcar planer was inserted onto the broach and used to smooth the calcar appropriately.  A trial stem, neutral  neck, and head were inserted into the acetabulum and placed through range of motion. Intraoperative radiographs were obtained to assess component position and leg length.  The hip was again dislocated and the femoral trial components were removed.  The actual stem was inserted into the femoral canal and then driven onto the calcar. The trial head was then again inserted on the femoral component and found to be appropriate. The trial head was then removed and the permanent head was Morse tapered onto the femoral stem and then reduced into the acetabulum.    The hip stability and length were reassessed and found to be satisfactory.  The wound was then copiously irrigated with normal saline solution. The tagged sutures of the capsule and piriformis were sewn to the gluteus medius tendon. This adequately closed the hip capsule. The IT band and gluteus maximus fascia were then closed with 0-Vicryl in a running, locked fashion. A mixture of Exparil and bupivicaine was administered.  The subdermal layer was closed with 2-0 Vicryl in a buried interrupted fashion. Skin was approximated with staples.  The wound was then covered with a Honeycomb dressing.  An abduction pillow was placed. The patient was mobilized from the lateral position back to supine on the operating room table.  A sling was placed on the left upper extremity to help immobilize the distal clavicle fracture.  The patient was then awakened from anesthesia without complication.  POSTOPERATIVE PLAN: The patient will be WBAT on left lower extremity. Nonweightbearing on left upper extremity and maintain sling as able. Lovenox 40mg /day x 4 weeks to start on POD#1. Ancef x 24 hours. PT/OT on POD#1. Posterior hip precautions.

## 2019-11-27 NOTE — Anesthesia Preprocedure Evaluation (Addendum)
Anesthesia Evaluation    Airway Mallampati: II  TM Distance: >3 FB Neck ROM: limited    Dental  (+) Upper Dentures, Chipped, Missing   Pulmonary COPD, former smoker,    Pulmonary exam normal        Cardiovascular Normal cardiovascular exam  2018 ECHO INTERPRETATION MILD LV SYSTOLIC DYSFUNCTION (EF 93%) NORMAL RIGHT VENTRICULAR SYSTOLIC FUNCTION MILD VALVULAR REGURGITATION NO VALVULAR STENOSIS   Neuro/Psych PSYCHIATRIC DISORDERS Dementia    GI/Hepatic neg GERD  ,  Endo/Other    Renal/GU Renal disease     Musculoskeletal   Abdominal   Peds  Hematology   Anesthesia Other Findings   Reproductive/Obstetrics                            Anesthesia Physical Anesthesia Plan  ASA: III and emergent  Anesthesia Plan: Spinal   Post-op Pain Management:    Induction: Intravenous  PONV Risk Score and Plan:   Airway Management Planned: Nasal Cannula and Natural Airway  Additional Equipment:   Intra-op Plan:   Post-operative Plan:   Informed Consent: I have reviewed the patients History and Physical, chart, labs and discussed the procedure including the risks, benefits and alternatives for the proposed anesthesia with the patient or authorized representative who has indicated his/her understanding and acceptance.       Plan Discussed with:   Anesthesia Plan Comments:         Anesthesia Quick Evaluation

## 2019-11-27 NOTE — Progress Notes (Signed)
PROGRESS NOTE    Angie Wilson  JOI:[id number] DOB: 28-Dec-1925 DOA: 11/26/2019 PCP: Rusty Aus, MD    Brief Narrative:  Angie Wilson is a 85 y.o. female with medical history significant of dementia,CVA,  nonischemic cardiomyopathy, PVD, intracerebral aneurysm, hypertension, and COPD who presents for unwitnessed fall. CT head and CT cervical spine showed no acute intracranial pathology.  No fractures of the cervical spine. Chest x-ray showed no acute cardiopulmonary disease and a nondisplaced fracture of the distal left clavicle Hip x-ray showed mildly displaced, noncomminuted left femoral neck fracture.    Consultants:   Ortho  Procedures: CT  Antimicrobials:   Cefazolin   Subjective: Daughter at bedside.  Patient has dementia.  Patient denies any shortness of breath, pain, chest pain.  Objective: Vitals:   11/27/19 1328 11/27/19 1330 11/27/19 1348 11/27/19 1411  BP: (!) 86/44 (!) 93/59  (!) 105/91  Pulse:    78  Resp:    18  Temp:  (!) 97 F (36.1 C)  98.4 F (36.9 C)  TempSrc:      SpO2:   98% 92%  Weight:      Height:        Intake/Output Summary (Last 24 hours) at 11/27/2019 1502 Last data filed at 11/27/2019 1227 Gross per 24 hour  Intake 267.12 ml  Output 500 ml  Net -232.88 ml   Filed Weights   11/26/19 1835 11/27/19 0100  Weight: 54.4 kg 52.2 kg    Examination:  General exam: Appears calm and comfortable, NAD daughter at bedside Respiratory system: Clear to auscultation. Respiratory effort normal. Cardiovascular system: S1 & S2 heard, RRR. No JVD, murmurs, rubs, gallops or clicks. No pedal edema. Gastrointestinal system: Abdomen is nondistended, soft and nontender. Normal bowel sounds heard. Central nervous system: Awake and alert, cooperative with exam no focal neurological deficits. Extremities: No cyanosis Psychiatry:Mood & affect appropriate.     Data Reviewed: I have personally reviewed following labs and imaging  studies  CBC: Recent Labs  Lab 11/26/19 1849  WBC 9.3  NEUTROABS 5.3  HGB 13.8  HCT 41.0  MCV 93.2  PLT 470   Basic Metabolic Panel: Recent Labs  Lab 11/26/19 1849  NA 141  K 4.8  CL 99  CO2 31  GLUCOSE 102*  BUN 29*  CREATININE 1.09*  CALCIUM 9.7   GFR: Estimated Creatinine Clearance: 27.7 mL/min (A) (by C-G formula based on SCr of 1.09 mg/dL (H)). Liver Function Tests: No results for input(s): AST, ALT, ALKPHOS, BILITOT, PROT, ALBUMIN in the last 168 hours. No results for input(s): LIPASE, AMYLASE in the last 168 hours. No results for input(s): AMMONIA in the last 168 hours. Coagulation Profile: Recent Labs  Lab 11/26/19 1849  INR 1.1   Cardiac Enzymes: No results for input(s): CKTOTAL, CKMB, CKMBINDEX, TROPONINI in the last 168 hours. BNP (last 3 results) No results for input(s): PROBNP in the last 8760 hours. HbA1C: No results for input(s): HGBA1C in the last 72 hours. CBG: No results for input(s): GLUCAP in the last 168 hours. Lipid Profile: No results for input(s): CHOL, HDL, LDLCALC, TRIG, CHOLHDL, LDLDIRECT in the last 72 hours. Thyroid Function Tests: No results for input(s): TSH, T4TOTAL, FREET4, T3FREE, THYROIDAB in the last 72 hours. Anemia Panel: No results for input(s): VITAMINB12, FOLATE, FERRITIN, TIBC, IRON, RETICCTPCT in the last 72 hours. Sepsis Labs: No results for input(s): PROCALCITON, LATICACIDVEN in the last 168 hours.  Recent Results (from the past 240 hour(s))  Respiratory Panel by RT PCR (  Flu A&B, Covid) - Nasopharyngeal Swab     Status: None   Collection Time: 11/26/19 10:00 PM   Specimen: Nasopharyngeal Swab  Result Value Ref Range Status   SARS Coronavirus 2 by RT PCR NEGATIVE NEGATIVE Final    Comment: (NOTE) SARS-CoV-2 target nucleic acids are NOT DETECTED. The SARS-CoV-2 RNA is generally detectable in upper respiratoy specimens during the acute phase of infection. The lowest concentration of SARS-CoV-2 viral copies this  assay can detect is 131 copies/mL. A negative result does not preclude SARS-Cov-2 infection and should not be used as the sole basis for treatment or other patient management decisions. A negative result may occur with  improper specimen collection/handling, submission of specimen other than nasopharyngeal swab, presence of viral mutation(s) within the areas targeted by this assay, and inadequate number of viral copies (<131 copies/mL). A negative result must be combined with clinical observations, patient history, and epidemiological information. The expected result is Negative. Fact Sheet for Patients:  https://www.moore.com/ Fact Sheet for Healthcare Providers:  https://www.young.biz/ This test is not yet ap proved or cleared by the [place] FDA and  has been authorized for detection and/or diagnosis of SARS-CoV-2 by FDA under an Emergency Use Authorization (EUA). This EUA will remain  in effect (meaning this test can be used) for the duration of the COVID-19 declaration under Section 564(b)(1) of the Act, 21 U.S.C. section 360bbb-3(b)(1), unless the authorization is terminated or revoked sooner.    Influenza A by PCR NEGATIVE NEGATIVE Final   Influenza B by PCR NEGATIVE NEGATIVE Final    Comment: (NOTE) The Xpert Xpress SARS-CoV-2/FLU/RSV assay is intended as an aid in  the diagnosis of influenza from Nasopharyngeal swab specimens and  should not be used as a sole basis for treatment. Nasal washings and  aspirates are unacceptable for Xpert Xpress SARS-CoV-2/FLU/RSV  testing. Fact Sheet for Patients: https://www.moore.com/ Fact Sheet for Healthcare Providers: https://www.young.biz/ This test is not yet approved or cleared by the [place] FDA and  has been authorized for detection and/or diagnosis of SARS-CoV-2 by  FDA under an Emergency Use Authorization (EUA). This EUA will remain  in  effect (meaning this test can be used) for the duration of the  Covid-19 declaration under Section 564(b)(1) of the Act, 21  U.S.C. section 360bbb-3(b)(1), unless the authorization is  terminated or revoked. Performed at [facility], [address], [place], [place] [address]   Surgical PCR screen     Status: None   Collection Time: 11/27/19  9:21 AM   Specimen: Nasal Mucosa; Nasal Swab  Result Value Ref Range Status   MRSA, PCR NEGATIVE NEGATIVE Final   Staphylococcus aureus NEGATIVE NEGATIVE Final    Comment: (NOTE) The Xpert SA Assay (FDA approved for NASAL specimens in patients 77 years of age and older), is one component of a comprehensive surveillance program. It is not intended to diagnose infection nor to guide or monitor treatment. Performed at [facility], [address], [place], [place] [address]          Radiology Studies: DG Chest 1 View  Result Date: 11/26/2019 CLINICAL DATA:  Pt presents to the ED for a fall. Pt is c/o L shoulder and L hip pain. Pt has dementia at baseline. Denies hitting her head and LOC. EXAM: CHEST  1 VIEW COMPARISON:  Chest CT, 10/12/2015. FINDINGS: Cardiac silhouette is normal size.  No mediastinal hilar masses. Prominent bronchovascular and interstitial lung markings. No evidence of pneumonia or pulmonary edema. No pleural  effusion or pneumothorax. Skeletal structures are demineralized. Nondisplaced fracture of the distal left clavicle is noted. IMPRESSION: 1. No acute cardiopulmonary disease. 2. Nondisplaced fracture of the distal left clavicle Electronically Signed   By: Amie Portlandavid  Ormond M.D.   On: 11/26/2019 19:43   CT Head Wo Contrast  Result Date: 11/26/2019 CLINICAL DATA:  Fall EXAM: CT HEAD WITHOUT CONTRAST CT CERVICAL SPINE WITHOUT CONTRAST TECHNIQUE: Multidetector CT imaging of the head and cervical spine was performed following the standard protocol without intravenous contrast. Multiplanar CT image  reconstructions of the cervical spine were also generated. COMPARISON:  09/17/2019 FINDINGS: CT HEAD FINDINGS Brain: No evidence of acute infarction, hemorrhage, hydrocephalus, extra-axial collection or mass lesion/mass effect. Extensive periventricular and deep white matter hypodensity. Mild global volume loss. Vascular: No hyperdense vessel or unexpected calcification. Skull: Normal. Negative for fracture or focal lesion. Sinuses/Orbits: No acute finding. Other: None. CT CERVICAL SPINE FINDINGS Alignment: Degenerative straightening of the normal cervical lordosis. Skull base and vertebrae: No acute fracture. No primary bone lesion or focal pathologic process. Soft tissues and spinal canal: No prevertebral fluid or swelling. No visible canal hematoma. Disc levels: Moderate disc space height loss and osteophytosis of the lower cervical spine. Upper chest: Negative. Other: None. IMPRESSION: 1. No acute intracranial pathology. 2. Advanced small-vessel white matter disease. 3. No fracture or static subluxation of the cervical spine. Electronically Signed   By: Lauralyn PrimesAlex  Bibbey M.D.   On: 11/26/2019 20:06   CT Cervical Spine Wo Contrast  Result Date: 11/26/2019 CLINICAL DATA:  Fall EXAM: CT HEAD WITHOUT CONTRAST CT CERVICAL SPINE WITHOUT CONTRAST TECHNIQUE: Multidetector CT imaging of the head and cervical spine was performed following the standard protocol without intravenous contrast. Multiplanar CT image reconstructions of the cervical spine were also generated. COMPARISON:  09/17/2019 FINDINGS: CT HEAD FINDINGS Brain: No evidence of acute infarction, hemorrhage, hydrocephalus, extra-axial collection or mass lesion/mass effect. Extensive periventricular and deep white matter hypodensity. Mild global volume loss. Vascular: No hyperdense vessel or unexpected calcification. Skull: Normal. Negative for fracture or focal lesion. Sinuses/Orbits: No acute finding. Other: None. CT CERVICAL SPINE FINDINGS Alignment:  Degenerative straightening of the normal cervical lordosis. Skull base and vertebrae: No acute fracture. No primary bone lesion or focal pathologic process. Soft tissues and spinal canal: No prevertebral fluid or swelling. No visible canal hematoma. Disc levels: Moderate disc space height loss and osteophytosis of the lower cervical spine. Upper chest: Negative. Other: None. IMPRESSION: 1. No acute intracranial pathology. 2. Advanced small-vessel white matter disease. 3. No fracture or static subluxation of the cervical spine. Electronically Signed   By: Lauralyn PrimesAlex  Bibbey M.D.   On: 11/26/2019 20:06   DG Pelvis Portable  Result Date: 11/27/2019 CLINICAL DATA:  Operative image during left hip arthroplasty. EXAM: PORTABLE PELVIS 1-2 VIEWS COMPARISON:  11/26/2019 FINDINGS: Single image shows placement of the femoral component of a left hip arthroplasty, which appears well seated. IMPRESSION: Portable operative image during left hip arthroplasty. Electronically Signed   By: Amie Portlandavid  Ormond M.D.   On: 11/27/2019 13:47   DG Shoulder Left  Result Date: 11/26/2019 CLINICAL DATA:  Fall.  Left shoulder pain. EXAM: LEFT SHOULDER - 2+ VIEW COMPARISON:  11/13/2007 FINDINGS: Nondisplaced, non comminuted fracture of the distal clavicle. No other fractures. The glenohumeral and AC joints are normally spaced and aligned. No bone lesions.  Skeletal structures are demineralized. Soft tissues are unremarkable. IMPRESSION: 1. Nondisplaced, nonangulated fracture of the distal left clavicle. 2. No dislocation.  No other fractures. Electronically Signed  By: Amie Portland M.D.   On: 11/26/2019 19:41   DG Hip Unilat W or Wo Pelvis 2-3 Views Left  Result Date: 11/26/2019 CLINICAL DATA:  Fall complaining of shoulder and left hip pain. EXAM: DG HIP (WITH OR WITHOUT PELVIS) 2-3V LEFT COMPARISON:  None. FINDINGS: Non comminuted fracture of the left mid femoral neck. The distal fracture component has displaced superiorly by approximately  2.5 cm. No other fractures. Hip joints, SI joints and pubic symphysis are normally aligned. Skeletal structures are diffusely demineralized. IMPRESSION: 1. Mildly displaced, non comminuted left femoral neck fracture. Electronically Signed   By: Amie Portland M.D.   On: 11/26/2019 19:40        Scheduled Meds: . acetaminophen  1,000 mg Oral Q8H  . docusate sodium  100 mg Oral BID  . [START ON 11/28/2019] enoxaparin (LOVENOX) injection  40 mg Subcutaneous Q24H   Continuous Infusions: . sodium chloride    .  ceFAZolin (ANCEF) IV      Assessment & Plan:   Principal Problem:   Hip fracture (HCC) Active Problems:   Clavicular fracture   Nonischemic cardiomyopathy (HCC)   AKI (acute kidney injury) (HCC)   Dementia without behavioral disturbance (HCC)   COPD with chronic bronchitis (HCC)   Left nondisplaced fracture of the distal clavicle - non-operable  - sling immobilizer   Displaced noncomminuted left femoral neck fracture S/p Left hip hemiarthroplasty-11/27/19 Pain control  AKI-minimal -creatinine of 1.09 - give small 250cc bolus Ck am labs  Alzheimer's dementia -stable. baseline is oriented to self and place only. - continue galantamine Trazadone prn for sleep  Nonischemic cardiomyopathy - continue aspirin euvolemic on exam, no exacerbation Decrease IV fluids to 50 mils an hour  Hypertension - hold lisinopril-HCTZ due to AKI  COPD -continue bronchodilator  Medication reconciliation was not completed at the time of admission.  Medication deemed necessary was ordered accordingly.  DVT prophylaxis:lovenox Code Status:Full  Family Communication:Daughter at bedside, all questions/concerned addressed disposition Plan: PT evaluation.  DC when stable from orthopedic standpoint      LOS: 1 day   Time spent: 45 minutes with more than 50% COC    Lynn Ito, MD [facility] Pager [phone]  If 7PM-7AM, please contact  night-coverage [email or web address] Password [email or web address] 11/27/2019, 3:02 PM

## 2019-11-27 NOTE — Anesthesia Post-op Follow-up Note (Signed)
Anesthesia QCDR form completed.        

## 2019-11-27 NOTE — Plan of Care (Signed)
New care plan initiated 

## 2019-11-27 NOTE — H&P (Signed)
H&P reviewed. No significant changes noted.  

## 2019-11-28 DIAGNOSIS — S42009D Fracture of unspecified part of unspecified clavicle, subsequent encounter for fracture with routine healing: Secondary | ICD-10-CM

## 2019-11-28 LAB — BASIC METABOLIC PANEL
Anion gap: 9 (ref 5–15)
BUN: 21 mg/dL (ref 8–23)
CO2: 29 mmol/L (ref 22–32)
Calcium: 8.3 mg/dL — ABNORMAL LOW (ref 8.9–10.3)
Chloride: 103 mmol/L (ref 98–111)
Creatinine, Ser: 1.06 mg/dL — ABNORMAL HIGH (ref 0.44–1.00)
GFR calc Af Amer: 53 mL/min — ABNORMAL LOW (ref >60)
GFR calc non Af Amer: 46 mL/min — ABNORMAL LOW (ref >60)
Glucose, Bld: 131 mg/dL — ABNORMAL HIGH (ref 70–99)
Potassium: 4.4 mmol/L (ref 3.5–5.1)
Sodium: 141 mmol/L (ref 135–145)

## 2019-11-28 LAB — CBC
HCT: 33.2 % — ABNORMAL LOW (ref 36.0–46.0)
Hemoglobin: 11.2 g/dL — ABNORMAL LOW (ref 12.0–15.0)
MCH: 31.5 pg (ref 26.0–34.0)
MCHC: 33.7 g/dL (ref 30.0–36.0)
MCV: 93.5 fL (ref 80.0–100.0)
Platelets: 190 10*3/uL (ref 150–400)
RBC: 3.55 MIL/uL — ABNORMAL LOW (ref 3.87–5.11)
RDW: 13.9 % (ref 11.5–15.5)
WBC: 10.8 10*3/uL — ABNORMAL HIGH (ref 4.0–10.5)
nRBC: 0 % (ref 0.0–0.2)

## 2019-11-28 MED ORDER — MOMETASONE FURO-FORMOTEROL FUM 100-5 MCG/ACT IN AERO
2.0000 | INHALATION_SPRAY | Freq: Two times a day (BID) | RESPIRATORY_TRACT | Status: DC
  Administered 2019-11-28 – 2019-11-29 (×2): 2 via RESPIRATORY_TRACT
  Filled 2019-11-28: qty 8.8

## 2019-11-28 MED ORDER — ENOXAPARIN SODIUM 30 MG/0.3ML ~~LOC~~ SOLN
30.0000 mg | SUBCUTANEOUS | Status: DC
  Administered 2019-11-29: 08:00:00 30 mg via SUBCUTANEOUS
  Filled 2019-11-28: qty 0.3

## 2019-11-28 MED ORDER — GALANTAMINE HYDROBROMIDE 4 MG PO TABS
12.0000 mg | ORAL_TABLET | Freq: Two times a day (BID) | ORAL | Status: DC
  Administered 2019-11-29: 12 mg via ORAL
  Filled 2019-11-28 (×2): qty 3

## 2019-11-28 MED ORDER — DIVALPROEX SODIUM 125 MG PO DR TAB
125.0000 mg | DELAYED_RELEASE_TABLET | Freq: Every day | ORAL | Status: DC
  Administered 2019-11-28 – 2019-11-29 (×2): 125 mg via ORAL
  Filled 2019-11-28 (×2): qty 1

## 2019-11-28 NOTE — Progress Notes (Signed)
PHARMACIST - PHYSICIAN COMMUNICATION  CONCERNING:  Enoxaparin (Lovenox) for DVT Prophylaxis   RECOMMENDATION: Patient was prescribed enoxaprin 40mg  q24 hours for VTE prophylaxis.   Filed Weights   11/26/19 1835 11/27/19 0100  Weight: 120 lb (54.4 kg) 115 lb 1.6 oz (52.2 kg)    Body mass index is 19.76 kg/m.  Estimated Creatinine Clearance: 28.5 mL/min (A) (by C-G formula based on SCr of 1.06 mg/dL (H)).  Per [employer]: Renal impairment (CrCl less than 30 mL/min) in prevention of DVT following hip or knee replacement surgery: 30 mg subQ once daily  Based on [facility] policy patient is candidate for enoxaparin 30mg  based on CrCl <30  DESCRIPTION: Pharmacy has adjusted enoxaparin dose per [facility] policy.   Patient is now receiving enoxaparin 30mg  every 24 hours.  Pernell Dupre, PharmD, BCPS Clinical Pharmacist 11/28/2019 8:57 AM

## 2019-11-28 NOTE — Progress Notes (Signed)
  Subjective: 1 Day Post-Op Procedure(s) (LRB): ARTHROPLASTY BIPOLAR HIP (HEMIARTHROPLASTY) (Left) Patient reports pain as mild.   Patient is well, and has had no acute complaints or problems Plan is to go Rehab after hospital stay. Negative for chest pain and shortness of breath Fever: no Gastrointestinal: Negative for nausea and vomiting  Objective: Vital signs in last 24 hours: Temp:  [97 F (36.1 C)-98.4 F (36.9 C)] 97.9 F (36.6 C) (12/13 2217) Pulse Rate:  [69-96] 96 (12/13 2217) Resp:  [16-18] 16 (12/13 2217) BP: (86-118)/(42-91) 118/55 (12/13 2217) SpO2:  [92 %-100 %] 97 % (12/13 2217)  Intake/Output from previous day:  Intake/Output Summary (Last 24 hours) at 11/28/2019 0614 Last data filed at 11/28/2019 0500 Gross per 24 hour  Intake 595.81 ml  Output 1000 ml  Net -404.19 ml    Intake/Output this shift: Total I/O In: 595.8 [I.V.:595.8] Out: 600 [Urine:600]  Labs: Recent Labs    11/26/19 1849 11/28/19 0324  HGB 13.8 11.2*   Recent Labs    11/26/19 1849 11/28/19 0324  WBC 9.3 10.8*  RBC 4.40 3.55*  HCT 41.0 33.2*  PLT 257 190   Recent Labs    11/26/19 1849 11/28/19 0324  NA 141 141  K 4.8 4.4  CL 99 103  CO2 31 29  BUN 29* 21  CREATININE 1.09* 1.06*  GLUCOSE 102* 131*  CALCIUM 9.7 8.3*   Recent Labs    11/26/19 1849  INR 1.1     EXAM General - Patient is Alert and Confused Extremity - Neurovascular intact Sensation intact distally Dorsiflexion/Plantar flexion intact Compartment soft  Abduction brace in place Left upper extremity mildly tender along the clavicle to palpation.  Limited range of motion of the shoulder. Dressing/Incision - clean, dry, no drainage Motor Function - intact, moving foot and toes well on exam.   Past Medical History:  Diagnosis Date  . Alzheimer's dementia ([place])   . Glaucoma     Assessment/Plan: 1 Day Post-Op Procedure(s) (LRB): ARTHROPLASTY BIPOLAR HIP (HEMIARTHROPLASTY) (Left) Principal  Problem:   Hip fracture (HCC) Active Problems:   Clavicular fracture   Nonischemic cardiomyopathy (HCC)   AKI (acute kidney injury) ([place])   Dementia without behavioral disturbance (HCC)   COPD with chronic bronchitis (HCC)   Closed fracture of neck of left femur (HCC)  Estimated body mass index is 19.76 kg/m as calculated from the following:   Height as of this encounter: 5\' 4"  (1.626 m).   Weight as of this encounter: 52.2 kg. Advance diet Up with therapy D/C IV fluids Discharge to SNF when cleared by medicine  Follow-up at [facility] clinic orthopedics in 2 weeks for staple removal and x-rays  DVT Prophylaxis - Lovenox, Foot Pumps and TED hose Weight-Bearing as tolerated to left leg  Reche Dixon, PA-C Orthopaedic Surgery 11/28/2019, 6:14 AM

## 2019-11-28 NOTE — Progress Notes (Signed)
PROGRESS NOTE    Angie Wilson  OFB:[id number] DOB: [birth year] DOA: 11/26/2019 PCP: Danella Penton, MD    Brief Narrative:  Mrs.  Wilson a 85 y.o.femalewith medical history significant ofdementia,CVA,nonischemic cardiomyopathy, PVD, intracerebral aneurysm, hypertension, and COPD who presents for unwitnessed fall. CT head and CT cervical spine showed no acute intracranial pathology. No fractures of the cervical spine. Chest x-ray showed no acute cardiopulmonary disease and a nondisplaced fracture of the distal left clavicle Hip x-ray showed mildly displaced, noncomminuted left femoral neck fracture.    Consultants:   Ortho  Procedures: CT  Antimicrobials:   Cefazolin   Subjective: Pt sitting in chair. Ready to eat. Has no pain, sob, cp, or any other complaints  Objective: Vitals:   11/27/19 1348 11/27/19 1411 11/27/19 2217 11/28/19 0815  BP:  (!) 105/91 (!) 118/55 (!) 127/57  Pulse:  78 96 92  Resp:  18 16 17   Temp:  98.4 F (36.9 C) 97.9 F (36.6 C) 98.9 F (37.2 C)  TempSrc:   Oral Oral  SpO2: 98% 92% 97% 97%  Weight:      Height:        Intake/Output Summary (Last 24 hours) at 11/28/2019 1300 Last data filed at 11/28/2019 0500 Gross per 24 hour  Intake 595.81 ml  Output 750 ml  Net -154.19 ml   Filed Weights   11/26/19 1835 11/27/19 0100  Weight: 54.4 kg 52.2 kg    Examination:  General exam: Appears calm and comfortable, NAD, pleasant Respiratory system: CTA, no rales rhonchi's or wheezing  Cardiovascular system: S1 & S2 heard, RRR.no murmurs  gastrointestinal system: Abdomen is nondistended, soft and nontender. Normal bowel sounds heard.  No rebound or guarding Central nervous system: Alert and awake. No focal neurological deficits. Extremities: No edema or cyanosis Skin: Warm dry Psychiatry:Mood & affect appropriate.     Data Reviewed: I have personally reviewed following labs and imaging studies  CBC: Recent Labs  Lab  11/26/19 1849 11/28/19 0324  WBC 9.3 10.8*  NEUTROABS 5.3  --   HGB 13.8 11.2*  HCT 41.0 33.2*  MCV 93.2 93.5  PLT 257 190   Basic Metabolic Panel: Recent Labs  Lab 11/26/19 1849 11/28/19 0324  NA 141 141  K 4.8 4.4  CL 99 103  CO2 31 29  GLUCOSE 102* 131*  BUN 29* 21  CREATININE 1.09* 1.06*  CALCIUM 9.7 8.3*   GFR: Estimated Creatinine Clearance: 28.5 mL/min (A) (by C-G formula based on SCr of 1.06 mg/dL (H)). Liver Function Tests: No results for input(s): AST, ALT, ALKPHOS, BILITOT, PROT, ALBUMIN in the last 168 hours. No results for input(s): LIPASE, AMYLASE in the last 168 hours. No results for input(s): AMMONIA in the last 168 hours. Coagulation Profile: Recent Labs  Lab 11/26/19 1849  INR 1.1   Cardiac Enzymes: No results for input(s): CKTOTAL, CKMB, CKMBINDEX, TROPONINI in the last 168 hours. BNP (last 3 results) No results for input(s): PROBNP in the last 8760 hours. HbA1C: No results for input(s): HGBA1C in the last 72 hours. CBG: No results for input(s): GLUCAP in the last 168 hours. Lipid Profile: No results for input(s): CHOL, HDL, LDLCALC, TRIG, CHOLHDL, LDLDIRECT in the last 72 hours. Thyroid Function Tests: No results for input(s): TSH, T4TOTAL, FREET4, T3FREE, THYROIDAB in the last 72 hours. Anemia Panel: No results for input(s): VITAMINB12, FOLATE, FERRITIN, TIBC, IRON, RETICCTPCT in the last 72 hours. Sepsis Labs: No results for input(s): PROCALCITON, LATICACIDVEN in the last 168 hours.  Recent Results (from the past 240 hour(s))  Respiratory Panel by RT PCR (Flu A&B, Covid) - Nasopharyngeal Swab     Status: None   Collection Time: 11/26/19 10:00 PM   Specimen: Nasopharyngeal Swab  Result Value Ref Range Status   SARS Coronavirus 2 by RT PCR NEGATIVE NEGATIVE Final    Comment: (NOTE) SARS-CoV-2 target nucleic acids are NOT DETECTED. The SARS-CoV-2 RNA is generally detectable in upper respiratoy specimens during the acute phase of  infection. The lowest concentration of SARS-CoV-2 viral copies this assay can detect is 131 copies/mL. A negative result does not preclude SARS-Cov-2 infection and should not be used as the sole basis for treatment or other patient management decisions. A negative result may occur with  improper specimen collection/handling, submission of specimen other than nasopharyngeal swab, presence of viral mutation(s) within the areas targeted by this assay, and inadequate number of viral copies (<131 copies/mL). A negative result must be combined with clinical observations, patient history, and epidemiological information. The expected result is Negative. Fact Sheet for Patients:  PinkCheek.be Fact Sheet for Healthcare Providers:  GravelBags.it This test is not yet ap proved or cleared by the [place] FDA and  has been authorized for detection and/or diagnosis of SARS-CoV-2 by FDA under an Emergency Use Authorization (EUA). This EUA will remain  in effect (meaning this test can be used) for the duration of the COVID-19 declaration under Section 564(b)(1) of the Act, 21 U.S.C. section 360bbb-3(b)(1), unless the authorization is terminated or revoked sooner.    Influenza A by PCR NEGATIVE NEGATIVE Final   Influenza B by PCR NEGATIVE NEGATIVE Final    Comment: (NOTE) The Xpert Xpress SARS-CoV-2/FLU/RSV assay is intended as an aid in  the diagnosis of influenza from Nasopharyngeal swab specimens and  should not be used as a sole basis for treatment. Nasal washings and  aspirates are unacceptable for Xpert Xpress SARS-CoV-2/FLU/RSV  testing. Fact Sheet for Patients: PinkCheek.be Fact Sheet for Healthcare Providers: GravelBags.it This test is not yet approved or cleared by the [place] FDA and  has been authorized for detection and/or diagnosis of SARS-CoV-2 by  FDA under  an Emergency Use Authorization (EUA). This EUA will remain  in effect (meaning this test can be used) for the duration of the  Covid-19 declaration under Section 564(b)(1) of the Act, 21  U.S.C. section 360bbb-3(b)(1), unless the authorization is  terminated or revoked. Performed at [facility], [address] [place], [place] [address]   Surgical PCR screen     Status: None   Collection Time: 11/27/19  9:21 AM   Specimen: Nasal Mucosa; Nasal Swab  Result Value Ref Range Status   MRSA, PCR NEGATIVE NEGATIVE Final   Staphylococcus aureus NEGATIVE NEGATIVE Final    Comment: (NOTE) The Xpert SA Assay (FDA approved for NASAL specimens in patients 31 years of age and older), is one component of a comprehensive surveillance program. It is not intended to diagnose infection nor to guide or monitor treatment. Performed at [facility], [address], [place], [place] [address]          Radiology Studies: DG Chest 1 View  Result Date: 11/26/2019 CLINICAL DATA:  Pt presents to the ED for a fall. Pt is c/o L shoulder and L hip pain. Pt has dementia at baseline. Denies hitting her head and LOC. EXAM: CHEST  1 VIEW COMPARISON:  Chest CT, 10/12/2015. FINDINGS: Cardiac silhouette is normal size.  No mediastinal hilar masses. Prominent bronchovascular  and interstitial lung markings. No evidence of pneumonia or pulmonary edema. No pleural effusion or pneumothorax. Skeletal structures are demineralized. Nondisplaced fracture of the distal left clavicle is noted. IMPRESSION: 1. No acute cardiopulmonary disease. 2. Nondisplaced fracture of the distal left clavicle Electronically Signed   By: Amie Portland M.D.   On: 11/26/2019 19:43   CT Head Wo Contrast  Result Date: 11/26/2019 CLINICAL DATA:  Fall EXAM: CT HEAD WITHOUT CONTRAST CT CERVICAL SPINE WITHOUT CONTRAST TECHNIQUE: Multidetector CT imaging of the head and cervical spine was performed following the standard protocol  without intravenous contrast. Multiplanar CT image reconstructions of the cervical spine were also generated. COMPARISON:  09/17/2019 FINDINGS: CT HEAD FINDINGS Brain: No evidence of acute infarction, hemorrhage, hydrocephalus, extra-axial collection or mass lesion/mass effect. Extensive periventricular and deep white matter hypodensity. Mild global volume loss. Vascular: No hyperdense vessel or unexpected calcification. Skull: Normal. Negative for fracture or focal lesion. Sinuses/Orbits: No acute finding. Other: None. CT CERVICAL SPINE FINDINGS Alignment: Degenerative straightening of the normal cervical lordosis. Skull base and vertebrae: No acute fracture. No primary bone lesion or focal pathologic process. Soft tissues and spinal canal: No prevertebral fluid or swelling. No visible canal hematoma. Disc levels: Moderate disc space height loss and osteophytosis of the lower cervical spine. Upper chest: Negative. Other: None. IMPRESSION: 1. No acute intracranial pathology. 2. Advanced small-vessel white matter disease. 3. No fracture or static subluxation of the cervical spine. Electronically Signed   By: Lauralyn Primes M.D.   On: 11/26/2019 20:06   CT Cervical Spine Wo Contrast  Result Date: 11/26/2019 CLINICAL DATA:  Fall EXAM: CT HEAD WITHOUT CONTRAST CT CERVICAL SPINE WITHOUT CONTRAST TECHNIQUE: Multidetector CT imaging of the head and cervical spine was performed following the standard protocol without intravenous contrast. Multiplanar CT image reconstructions of the cervical spine were also generated. COMPARISON:  09/17/2019 FINDINGS: CT HEAD FINDINGS Brain: No evidence of acute infarction, hemorrhage, hydrocephalus, extra-axial collection or mass lesion/mass effect. Extensive periventricular and deep white matter hypodensity. Mild global volume loss. Vascular: No hyperdense vessel or unexpected calcification. Skull: Normal. Negative for fracture or focal lesion. Sinuses/Orbits: No acute finding. Other:  None. CT CERVICAL SPINE FINDINGS Alignment: Degenerative straightening of the normal cervical lordosis. Skull base and vertebrae: No acute fracture. No primary bone lesion or focal pathologic process. Soft tissues and spinal canal: No prevertebral fluid or swelling. No visible canal hematoma. Disc levels: Moderate disc space height loss and osteophytosis of the lower cervical spine. Upper chest: Negative. Other: None. IMPRESSION: 1. No acute intracranial pathology. 2. Advanced small-vessel white matter disease. 3. No fracture or static subluxation of the cervical spine. Electronically Signed   By: Lauralyn Primes M.D.   On: 11/26/2019 20:06   DG Pelvis Portable  Result Date: 11/27/2019 CLINICAL DATA:  Status post left hip replacement EXAM: PORTABLE PELVIS 1-2 VIEWS COMPARISON:  None. FINDINGS: Left hip prosthesis is noted. Pelvic ring is intact. No gross soft tissue or bony abnormality is seen. IMPRESSION: Status post left hip prosthesis Electronically Signed   By: Alcide Clever M.D.   On: 11/27/2019 15:08   DG Pelvis Portable  Result Date: 11/27/2019 CLINICAL DATA:  Operative image during left hip arthroplasty. EXAM: PORTABLE PELVIS 1-2 VIEWS COMPARISON:  11/26/2019 FINDINGS: Single image shows placement of the femoral component of a left hip arthroplasty, which appears well seated. IMPRESSION: Portable operative image during left hip arthroplasty. Electronically Signed   By: Amie Portland M.D.   On: 11/27/2019 13:47   DG Shoulder  Left  Result Date: 11/26/2019 CLINICAL DATA:  Fall.  Left shoulder pain. EXAM: LEFT SHOULDER - 2+ VIEW COMPARISON:  11/13/2007 FINDINGS: Nondisplaced, non comminuted fracture of the distal clavicle. No other fractures. The glenohumeral and AC joints are normally spaced and aligned. No bone lesions.  Skeletal structures are demineralized. Soft tissues are unremarkable. IMPRESSION: 1. Nondisplaced, nonangulated fracture of the distal left clavicle. 2. No dislocation.  No other  fractures. Electronically Signed   By: Amie Portlandavid  Ormond M.D.   On: 11/26/2019 19:41   DG Hip Unilat W or Wo Pelvis 2-3 Views Left  Result Date: 11/26/2019 CLINICAL DATA:  Fall complaining of shoulder and left hip pain. EXAM: DG HIP (WITH OR WITHOUT PELVIS) 2-3V LEFT COMPARISON:  None. FINDINGS: Non comminuted fracture of the left mid femoral neck. The distal fracture component has displaced superiorly by approximately 2.5 cm. No other fractures. Hip joints, SI joints and pubic symphysis are normally aligned. Skeletal structures are diffusely demineralized. IMPRESSION: 1. Mildly displaced, non comminuted left femoral neck fracture. Electronically Signed   By: Amie Portlandavid  Ormond M.D.   On: 11/26/2019 19:40        Scheduled Meds: . acetaminophen  1,000 mg Oral Q8H  . docusate sodium  100 mg Oral BID  . [START ON 11/29/2019] enoxaparin (LOVENOX) injection  30 mg Subcutaneous Q24H   Continuous Infusions: . sodium chloride 50 mL/hr at 11/28/19 0500    Assessment & Plan:   Principal Problem:   Hip fracture (HCC) Active Problems:   Clavicular fracture   Nonischemic cardiomyopathy (HCC)   AKI (acute kidney injury) (HCC)   Dementia without behavioral disturbance (HCC)   COPD with chronic bronchitis (HCC)   Closed fracture of neck of left femur (HCC)   Left nondisplaced fracture of the distal clavicle - non-operable  - sling immobilizer   Displaced noncomminuted left femoral neck fracture S/p Lefthip hemiarthroplasty-11/27/19 Pain control PT/ OT recommend SNF  AKI-minimal -creatinine of 1.09 - give small 250cc bolus Stable  Alzheimer's dementia -stable. baseline is oriented to self andplaceonly. - continue galantamine Trazadone prn for sleep  Nonischemic cardiomyopathy - continue aspirin euvolemic on exam, no exacerbation Decrease IV fluids to 50 mils an hour  Hypertension - hold lisinopril-HCTZ due to AKI  COPD -continue bronchodilator    DVT prophylaxis:  Lovenox Code Status: Full Family Communication: None at bedside Disposition Plan: SNF bed and authorization pending.  Case discussed with case management and nursing       LOS: 2 days   Time spent: 45 minutes with more than 50% COC    Lynn ItoSahar Tine Mabee, MD [facility] Pager [phone]  If 7PM-7AM, please contact night-coverage [email or web address] Password [email or web address] 11/28/2019, 1:00 PM

## 2019-11-28 NOTE — Anesthesia Postprocedure Evaluation (Signed)
Anesthesia Post Note  Patient: Angie Wilson  Procedure(s) Performed: ARTHROPLASTY BIPOLAR HIP (HEMIARTHROPLASTY) (Left Hip)  Patient location during evaluation: PACU Anesthesia Type: Spinal Level of consciousness: oriented and awake and alert Pain management: pain level controlled Vital Signs Assessment: post-procedure vital signs reviewed and stable Respiratory status: spontaneous breathing, respiratory function stable and patient connected to nasal cannula oxygen Cardiovascular status: blood pressure returned to baseline and stable Postop Assessment: no headache, no backache and no apparent nausea or vomiting Anesthetic complications: no     Last Vitals:  Vitals:   11/27/19 2217 11/28/19 0815  BP: (!) 118/55 (!) 127/57  Pulse: 96 92  Resp: 16 17  Temp: 36.6 C 37.2 C  SpO2: 97% 97%    Last Pain:  Vitals:   11/28/19 0815  TempSrc: Oral  PainSc:                  Estill Batten

## 2019-11-28 NOTE — TOC Progression Note (Signed)
Transition of Care [facility]) - Progression Note    Patient Details  Name: JONNI OELKERS MRN: [id number] Date of Birth: 08/18/1926  [facility] CM/SW Contact  Shade Flood, LCSW Phone Number: 11/28/2019, 4:36 PM  Clinical Narrative:     Received call from [facility] with auth for pt's rehab. Josem Kaufmann number is [phone] and is good 12/15-12/17 with update due to [facility] on 12/17. Called Seth Bake at [facility] to update. Pt's daughter aware of plan for dc tomorrow to [facility] and she is in agreement.   Assigned TOC will follow up tomorrow.  Expected Discharge Plan: Skilled Nursing Facility Barriers to Discharge: SNF Pending bed offer, Insurance Authorization  Expected Discharge Plan and Services Expected Discharge Plan: [place] In-house Referral: Clinical Social Work   Post Acute Care Choice: [place] Living arrangements for the past 2 months: Single Family Home                                       Social Determinants of Health (SDOH) Interventions    Readmission Risk Interventions Readmission Risk Prevention Plan 11/28/2019  Transportation Screening Complete  Home Care Screening Not Complete  Home Care Screening Not Completed Comments Going to SNF rehab  Medication Review (RN CM) Complete  Some recent data might be hidden

## 2019-11-28 NOTE — Progress Notes (Signed)
Physical Therapy Treatment Patient Details Name: Angie Wilson MRN: [id number] DOB: December 06, 1926 Today's Date: 11/28/2019    History of Present Illness Pt is admitted for fall at home resulting in L hip fx and nondisplaced clavicle fx. Pt is now s/p L hip hemi post approach on 12/13. History includes nonischemic cardiomyopathy, AKI, COPD, and Alzheimer's dx.    PT Comments    Pt is making gradual progress towards goals needing +2 assist for SPT from recliner to bed. Unable to maintain correct WBing on L UE. Has increased pain with all there-ex, RN notified. Remains confused with no carryover regarding hip precautions. Will continue to progress as able.   Follow Up Recommendations  SNF     Equipment Recommendations  (TBD)    Recommendations for Other Services       Precautions / Restrictions Precautions Precautions: Fall;Posterior Hip Precaution Booklet Issued: No Precaution Comments: per orders, needs sling on L UE, not in room and NSG aware Required Braces or Orthoses: Sling Restrictions Weight Bearing Restrictions: Yes LUE Weight Bearing: Non weight bearing LLE Weight Bearing: Weight bearing as tolerated Other Position/Activity Restrictions: post hip    Mobility  Bed Mobility Overal bed mobility: Needs Assistance Bed Mobility: Sit to Supine       Sit to supine: Total assist;+2 for physical assistance   General bed mobility comments: needs assist for transition back to bed. Pt cries out during movement. Needs +2 for repositioning in bed  Transfers Overall transfer level: Needs assistance Equipment used: 2 person hand held assist Transfers: Sit to/from Stand Sit to Stand: Max assist;+2 physical assistance         General transfer comment: 3 attempts to assist pt to standing. Pt very fearful of movement. Takes max encouragement to stand. Tries to use L UE despite cues. SPT to bed performed  Ambulation/Gait             General Gait Details: unable at  this time   Stairs             Wheelchair Mobility    Modified Rankin (Stroke Patients Only)       Balance Overall balance assessment: Needs assistance;History of Falls Sitting-balance support: Feet supported Sitting balance-Leahy Scale: Poor Sitting balance - Comments: post lean   Standing balance support: Single extremity supported Standing balance-Leahy Scale: Poor Standing balance comment: post lean                            Cognition Arousal/Alertness: Awake/alert Behavior During Therapy: WFL for tasks assessed/performed Overall Cognitive Status: History of cognitive impairments - at baseline                                 General Comments: Pt believes shes at home and asks therapist to get husband out from his bedroom      Exercises Other Exercises Other Exercises: Seated ther-ex performed on B LE including AP, SLRs, QS, and hip abd/add. 10 reps with mod assist on B LE and cues for technique. Reports pain with movement.     General Comments        Pertinent Vitals/Pain Pain Assessment: Faces Faces Pain Scale: Hurts whole lot Pain Location: L hip with movement Pain Descriptors / Indicators: Operative site guarding;Discomfort;Dull Pain Intervention(s): Limited activity within patient's tolerance    Home Living Family/patient expects to be discharged to:: Private residence Living Arrangements:  Spouse/significant other Available Help at Discharge: Family Type of Home: House       Home Equipment: Walker - 2 wheels Additional Comments: due to confusion, unsure of accuracy of home environment    Prior Function        Comments: uncertain. Pt recognizes RW, however unsure if regulary used   PT Goals (current goals can now be found in the care plan section) Acute Rehab PT Goals Patient Stated Goal: unable to state PT Goal Formulation: Patient unable to participate in goal setting Time For Goal Achievement:  12/12/19 Potential to Achieve Goals: Fair Additional Goals Additional Goal #1: Pt will be able to perform bed mobility/transfers with mod A +2 to improve functional independence Progress towards PT goals: Progressing toward goals    Frequency    BID      PT Plan      Co-evaluation              AM-PAC PT "6 Clicks" Mobility   Outcome Measure  Help needed turning from your back to your side while in a flat bed without using bedrails?: Total Help needed moving from lying on your back to sitting on the side of a flat bed without using bedrails?: Total Help needed moving to and from a bed to a chair (including a wheelchair)?: Total Help needed standing up from a chair using your arms (e.g., wheelchair or bedside chair)?: Total Help needed to walk in hospital room?: Total Help needed climbing 3-5 steps with a railing? : Total 6 Click Score: 6    End of Session Equipment Utilized During Treatment: Gait belt;Oxygen Activity Tolerance: Patient tolerated treatment well Patient left: in bed;with bed alarm set;with SCD's reapplied Nurse Communication: Mobility status PT Visit Diagnosis: Unsteadiness on feet (R26.81);Repeated falls (R29.6);Muscle weakness (generalized) (M62.81);History of falling (Z91.81);Difficulty in walking, not elsewhere classified (R26.2);Pain Pain - Right/Left: Left Pain - part of body: Leg     Time: 5916-3846 PT Time Calculation (min) (ACUTE ONLY): 26 min  Charges:  $Therapeutic Exercise: 8-22 mins $Therapeutic Activity: 8-22 mins                     Greggory Stallion, PT, DPT [phone]    Angie Wilson 11/28/2019, 3:17 PM
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
## 2019-11-28 NOTE — Discharge Instructions (Signed)
INSTRUCTIONS AFTER Surgery  o Remove items at home which could result in a fall. This includes throw rugs or furniture in walking pathways o ICE to the affected joint every three hours while awake for 30 minutes at a time, for at least the first 3-5 days, and then as needed for pain and swelling.  Continue to use ice for pain and swelling. You may notice swelling that will progress down to the foot and ankle.  This is normal after surgery.  Elevate your leg when you are not up walking on it.   o Continue to use the breathing machine you got in the hospital (incentive spirometer) which will help keep your temperature down.  It is common for your temperature to cycle up and down following surgery, especially at night when you are not up moving around and exerting yourself.  The breathing machine keeps your lungs expanded and your temperature down.   DIET:  As you were doing prior to hospitalization, we recommend a well-balanced diet.  DRESSING / WOUND CARE / SHOWERING  Keep dressing clean and dry.  No showering.  Staples will be removed at [facility] clinic in 2 weeks.  ACTIVITY  o Increase activity slowly as tolerated, but follow the weight bearing instructions below.   o No driving for 6 weeks or until further direction given by your physician.  You cannot drive while taking narcotics.  o No lifting or carrying greater than 10 lbs. until further directed by your surgeon. o Avoid periods of inactivity such as sitting longer than an hour when not asleep. This helps prevent blood clots.  o You may return to work once you are authorized by your doctor.     WEIGHT BEARING  Weightbearing as tolerated on the left lower extremity.  Able to use a walker with pressure using her left arm.   EXERCISES Ambulation and gait training.  Strengthening.  CONSTIPATION  Constipation is defined medically as fewer than three stools per week and severe constipation as less than one stool per week.  Even if you  have a regular bowel pattern at home, your normal regimen is likely to be disrupted due to multiple reasons following surgery.  Combination of anesthesia, postoperative narcotics, change in appetite and fluid intake all can affect your bowels.   YOU MUST use at least one of the following options; they are listed in order of increasing strength to get the job done.  They are all available over the counter, and you may need to use some, POSSIBLY even all of these options:    Drink plenty of fluids (prune juice may be helpful) and high fiber foods Colace 100 mg by mouth twice a day  Senokot for constipation as directed and as needed Dulcolax (bisacodyl), take with full glass of water  Miralax (polyethylene glycol) once or twice a day as needed.  If you have tried all these things and are unable to have a bowel movement in the first 3-4 days after surgery call either your surgeon or your primary doctor.    If you experience loose stools or diarrhea, hold the medications until you stool forms back up.  If your symptoms do not get better within 1 week or if they get worse, check with your doctor.  If you experience "the worst abdominal pain ever" or develop nausea or vomiting, please contact the office immediately for further recommendations for treatment.   ITCHING:  If you experience itching with your medications, try taking only a  single pain pill, or even half a pain pill at a time.  You can also use Benadryl over the counter for itching or also to help with sleep.   TED HOSE STOCKINGS:  Use stockings on both legs until for at least 2 weeks or as directed by physician office. They may be removed at night for sleeping.  MEDICATIONS:  See your medication summary on the "After Visit Summary" that nursing will review with you.  You may have some home medications which will be placed on hold until you complete the course of blood thinner medication.  It is important for you to complete the blood thinner  medication as prescribed.  PRECAUTIONS:  If you experience chest pain or shortness of breath - call 911 immediately for transfer to the hospital emergency department.   If you develop a fever greater that 101 F, purulent drainage from wound, increased redness or drainage from wound, foul odor from the wound/dressing, or calf pain - CONTACT YOUR SURGEON.                                                   FOLLOW-UP APPOINTMENTS:  If you do not already have a post-op appointment, please call the office for an appointment to be seen by your surgeon.  Guidelines for how soon to be seen are listed in your "After Visit Summary", but are typically between 1-4 weeks after surgery.  OTHER INSTRUCTIONS:     MAKE SURE YOU:  . Understand these instructions.  . Get help right away if you are not doing well or get worse.    Thank you for letting us be a part of your medical care team.  It is a privilege we respect greatly.  We hope these instructions will help you stay on track for a fast and full recovery!

## 2019-11-28 NOTE — TOC Initial Note (Signed)
Transition of Care [facility]) - Initial/Assessment Note    Patient Details  Name: Angie Wilson MRN: [id number] Date of Birth: 1926/04/12  [facility] CM/SW Contact:    Elliot Gault, LCSW Phone Number: 11/28/2019, 1:01 PM  Clinical Narrative:                  Pt admitted from home. She lives with her husband. Pt has dementia. Her daughter is here with her and TOC spoke with dtr to assess. Pt uses a walker at home. Family assists pt as needed.   Daughter aware of SNF rehab recommendation and she requests referral to [facility]. Will refer and start insurance authorization. Per MD, pt medically stable for dc.  Expected Discharge Plan: Skilled Nursing Facility Barriers to Discharge: SNF Pending bed offer, Insurance Authorization   Patient Goals and CMS Choice Patient states their goals for this hospitalization and ongoing recovery are:: return to prior level of function CMS Medicare.gov Compare Post Acute Care list provided to:: Patient Represenative (must comment) Choice offered to / list presented to : Adult Children  Expected Discharge Plan and Services Expected Discharge Plan: Skilled Nursing Facility In-house Referral: Clinical Social Work   Post Acute Care Choice: Skilled Nursing Facility Living arrangements for the past 2 months: Single Family Home                                      Prior Living Arrangements/Services Living arrangements for the past 2 months: Single Family Home Lives with:: Spouse Patient language and need for interpreter reviewed:: Yes Do you feel safe going back to the place where you live?: Yes      Need for Family Participation in Patient Care: Yes (Comment) Care giver support system in place?: Yes (comment) Current home services: DME Criminal Activity/Legal Involvement Pertinent to Current Situation/Hospitalization: No - Comment as needed  Activities of Daily Living Home Assistive Devices/Equipment: Other  (Comment)(cane) ADL Screening (condition at time of admission) Patient's cognitive ability adequate to safely complete daily activities?: Yes Is the patient deaf or have difficulty hearing?: No Does the patient have difficulty seeing, even when wearing glasses/contacts?: No Does the patient have difficulty concentrating, remembering, or making decisions?: Yes Patient able to express need for assistance with ADLs?: Yes Does the patient have difficulty dressing or bathing?: Yes Independently performs ADLs?: No Communication: Independent Dressing (OT): Independent Grooming: Independent Feeding: Independent Bathing: Independent Toileting: Independent In/Out Bed: Independent Walks in Home: Independent Does the patient have difficulty walking or climbing stairs?: Yes Weakness of Legs: Left Weakness of Arms/Hands: Left  Permission Sought/Granted Permission sought to share information with : Oceanographer granted to share information with : Yes, Verbal Permission Granted     Permission granted to share info w AGENCY: local SNFs        Emotional Assessment Appearance:: Appears stated age Attitude/Demeanor/Rapport: Engaged Affect (typically observed): Pleasant Orientation: : Oriented to Self, Oriented to Place Alcohol / Substance Use: Not Applicable Psych Involvement: No (comment)  Admission diagnosis:  Hip fracture (HCC) [S72.009A] Fall, initial encounter [W19.XXXA] Closed fracture of neck of left femur, initial encounter (HCC) [S72.002A] Patient Active Problem List   Diagnosis Date Noted  . Clavicular fracture 11/27/2019  . Nonischemic cardiomyopathy (HCC) 11/27/2019  . AKI (acute kidney injury) (HCC) 11/27/2019  . Dementia without behavioral disturbance (HCC) 11/27/2019  . COPD with chronic bronchitis (HCC) 11/27/2019  . Closed  fracture of neck of left femur ([place])   . Hip fracture ([place]) 11/26/2019   PCP:  Rusty Aus, MD Pharmacy:   [facility] [address] [place] [address] Phone: [phone] Fax: [phone]     Social Determinants of Health (SDOH) Interventions    Readmission Risk Interventions Readmission Risk Prevention Plan 11/28/2019  Transportation Screening Complete  Home Care Screening Not Complete  Home Care Screening Not Completed Comments Going to SNF rehab  Medication Review (RN CM) Complete  Some recent data might be hidden
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
## 2019-11-28 NOTE — NC FL2 (Signed)
[place] LEVEL OF CARE SCREENING TOOL     IDENTIFICATION  Patient Name: Angie Wilson Birthdate: 08-07-1926 Sex: female Admission Date (Current Location): 11/26/2019  [place] and [place] Number:  Engineering geologist and Address:  [facility], [address], [place], [place] [address]      Provider Number: [id number]  Attending Physician Name and Address:  Nolberto Hanlon, MD  Relative Name and Phone Number:       Current Level of Care: Hospital Recommended Level of Care: [place] Prior Approval Number:    Date Approved/Denied:   PASRR Number: [id number]  Discharge Plan: SNF    Current Diagnoses: Patient Active Problem List   Diagnosis Date Noted  . Clavicular fracture 11/27/2019  . Nonischemic cardiomyopathy ([place]) 11/27/2019  . AKI (acute kidney injury) ([place]) 11/27/2019  . Dementia without behavioral disturbance ([place]) 11/27/2019  . COPD with chronic bronchitis ([place]) 11/27/2019  . Closed fracture of neck of left femur ([place])   . Hip fracture ([place]) 11/26/2019    Orientation RESPIRATION BLADDER Height & Weight     Self, Place  O2 Continent Weight: 115 lb 1.6 oz (52.2 kg) Height:  5\' 4"  (162.6 cm)  BEHAVIORAL SYMPTOMS/MOOD NEUROLOGICAL BOWEL NUTRITION STATUS      Continent Diet(see dc summary)  AMBULATORY STATUS COMMUNICATION OF NEEDS Skin   Extensive Assist Verbally Surgical wounds                       Personal Care Assistance Level of Assistance  Bathing, Feeding, Dressing Bathing Assistance: Limited assistance Feeding assistance: Independent Dressing Assistance: Limited assistance     Functional Limitations Info  Sight, Hearing, Speech Sight Info: Adequate Hearing Info: Adequate Speech Info: Adequate    SPECIAL CARE FACTORS FREQUENCY  PT (By licensed PT), OT (By licensed OT)     PT Frequency: 5 times week OT Frequency: 2-3 times week            Contractures Contractures Info:  Not present    Additional Factors Info  Code Status, Allergies Code Status Info: Full Allergies Info: Penicillins, Prednisone, Sulfa antibiotics           Current Medications (11/28/2019):  This is the current hospital active medication list Current Facility-Administered Medications  Medication Dose Route Frequency Provider Last Rate Last Admin  . 0.9 %  sodium chloride infusion   Intravenous Continuous Nolberto Hanlon, MD 50 mL/hr at 11/28/19 0500 Rate Verify at 11/28/19 0500  . acetaminophen (TYLENOL) tablet 1,000 mg  1,000 mg Oral Q8H Leim Fabry, MD   1,000 mg at 11/28/19 0617  . bisacodyl (DULCOLAX) suppository 10 mg  10 mg Rectal Daily PRN Leim Fabry, MD      . docusate sodium (COLACE) capsule 100 mg  100 mg Oral BID Leim Fabry, MD   100 mg at 11/28/19 1012  . [START ON 11/29/2019] enoxaparin (LOVENOX) injection 30 mg  30 mg Subcutaneous Q24H Hallaji, Sheema M, RPH      . HYDROmorphone (DILAUDID) injection 0.2-0.4 mg  0.2-0.4 mg Intravenous Q4H PRN Leim Fabry, MD      . menthol-cetylpyridinium (CEPACOL) lozenge 3 mg  1 lozenge Oral PRN Leim Fabry, MD       Or  . phenol (CHLORASEPTIC) mouth spray 1 spray  1 spray Mouth/Throat PRN Leim Fabry, MD      . metoCLOPramide (REGLAN) tablet 5-10 mg  5-10 mg Oral Q8H PRN Leim Fabry, MD  Or  . metoCLOPramide (REGLAN) injection 5-10 mg  5-10 mg Intravenous Q8H PRN Signa Kell, MD      . ondansetron [facility]) tablet 4 mg  4 mg Oral Q6H PRN Signa Kell, MD       Or  . ondansetron [facility]) injection 4 mg  4 mg Intravenous Q6H PRN Signa Kell, MD      . oxyCODONE (Oxy IR/ROXICODONE) immediate release tablet 2.5-5 mg  2.5-5 mg Oral Q4H PRN Signa Kell, MD      . oxyCODONE (Oxy IR/ROXICODONE) immediate release tablet 5-10 mg  5-10 mg Oral Q4H PRN Signa Kell, MD   10 mg at 11/27/19 1951  . senna-docusate (Senokot-S) tablet 1 tablet  1 tablet Oral QHS PRN Signa Kell, MD   1 tablet at 11/27/19 1951  . traMADol (ULTRAM) tablet 50  mg  50 mg Oral Q6H PRN Signa Kell, MD      . traZODone (DESYREL) tablet 50 mg  50 mg Oral QHS PRN Lynn Ito, MD   50 mg at 11/27/19 1950     Discharge Medications: Please see discharge summary for a list of discharge medications.  Relevant Imaging Results:  Relevant Lab Results:   Additional Information SSN: [id number]  Elliot Gault, LCSW

## 2019-11-29 LAB — CBC
HCT: 31 % — ABNORMAL LOW (ref 36.0–46.0)
Hemoglobin: 10.4 g/dL — ABNORMAL LOW (ref 12.0–15.0)
MCH: 31.3 pg (ref 26.0–34.0)
MCHC: 33.5 g/dL (ref 30.0–36.0)
MCV: 93.4 fL (ref 80.0–100.0)
Platelets: 151 10*3/uL (ref 150–400)
RBC: 3.32 MIL/uL — ABNORMAL LOW (ref 3.87–5.11)
RDW: 14.1 % (ref 11.5–15.5)
WBC: 13.3 10*3/uL — ABNORMAL HIGH (ref 4.0–10.5)
nRBC: 0 % (ref 0.0–0.2)

## 2019-11-29 LAB — SURGICAL PATHOLOGY

## 2019-11-29 LAB — BASIC METABOLIC PANEL
Anion gap: 6 (ref 5–15)
BUN: 20 mg/dL (ref 8–23)
CO2: 29 mmol/L (ref 22–32)
Calcium: 8.2 mg/dL — ABNORMAL LOW (ref 8.9–10.3)
Chloride: 103 mmol/L (ref 98–111)
Creatinine, Ser: 0.91 mg/dL (ref 0.44–1.00)
GFR calc Af Amer: >60 mL/min (ref >60)
GFR calc non Af Amer: 55 mL/min — ABNORMAL LOW (ref >60)
Glucose, Bld: 136 mg/dL — ABNORMAL HIGH (ref 70–99)
Potassium: 3.8 mmol/L (ref 3.5–5.1)
Sodium: 138 mmol/L (ref 135–145)

## 2019-11-29 MED ORDER — TRAMADOL HCL 50 MG PO TABS: 50.0000 mg | ORAL_TABLET | Freq: Four times a day (QID) | ORAL | 0 refills | Status: AC | PRN

## 2019-11-29 MED ORDER — SENNOSIDES-DOCUSATE SODIUM 8.6-50 MG PO TABS: 1.0000 | ORAL_TABLET | Freq: Every evening | ORAL | Status: AC | PRN

## 2019-11-29 MED ORDER — ENOXAPARIN SODIUM 30 MG/0.3ML ~~LOC~~ SOLN: 30.0000 mg | SUBCUTANEOUS | 0 refills | Status: AC

## 2019-11-29 NOTE — Progress Notes (Signed)
Called report to [place] @ [facility]. Answered all questions. EMS called for transport.

## 2019-11-29 NOTE — Progress Notes (Addendum)
Update 2:08pm: Patient set to discharge to [facility] today- discharge summary sent to facility. RN notified to call report to [phone] room number is [phone]. Daughter, Laurence Aly, notified and voiced no concerns at this time.   CSW received insurance authorization for patient to go to [facility] Josem Kaufmann: [id number])- CSW notified family and MD. Patient able to go to facility today if medically stable- will follow up  Kingsley Spittle, [place]  [phone]

## 2019-11-29 NOTE — Progress Notes (Signed)
Physical Therapy Treatment Patient Details Name: Angie Wilson MRN: [id number] DOB: Mar 18, 1926 Today's Date: 11/29/2019    History of Present Illness Pt is admitted for fall at home resulting in L hip fx and nondisplaced clavicle fx. Pt is now s/p L hip hemi post approach on 12/13. History includes nonischemic cardiomyopathy, AKI, COPD, and Alzheimer's dx.    PT Comments    Pt is making gradual progress towards goals with ability to transition from bed->chair with decreased pain. Pt still confused about why she is in pain although does acknowledge she is in the hospital. Able to participate in there-ex. Pt performed all mobility on RA with O2 at 90% with exertion. Reports SOB symptoms with exertion, O2 reapplied. Will continue to progress. May dc today.    Follow Up Recommendations  SNF     Equipment Recommendations       Recommendations for Other Services       Precautions / Restrictions Precautions Precautions: Fall;Posterior Hip Precaution Booklet Issued: No Precaution Comments: per orders, needs sling on L UE, not in room and NSG aware Required Braces or Orthoses: Sling Restrictions Weight Bearing Restrictions: Yes LUE Weight Bearing: Non weight bearing LLE Weight Bearing: Weight bearing as tolerated    Mobility  Bed Mobility Overal bed mobility: Needs Assistance Bed Mobility: Supine to Sit     Supine to sit: Max assist;+2 for physical assistance     General bed mobility comments: pt very fearful of movement and cries out in anticipation for movement. Once seated at EOB, able to sit with cga  Transfers Overall transfer level: Needs assistance Equipment used: 2 person hand held assist Transfers: Sit to/from Stand Sit to Stand: Max assist;+2 physical assistance         General transfer comment: transfers performed with upright posture. Still attempts to use L UE. Needs +2 for SPT to recliner.  Ambulation/Gait             General Gait Details:  unable at this time   Stairs             Wheelchair Mobility    Modified Rankin (Stroke Patients Only)       Balance Overall balance assessment: Needs assistance;History of Falls Sitting-balance support: Feet supported Sitting balance-Leahy Scale: Poor Sitting balance - Comments: post lean   Standing balance support: Single extremity supported Standing balance-Leahy Scale: Poor Standing balance comment: post lean                            Cognition Arousal/Alertness: Awake/alert Behavior During Therapy: WFL for tasks assessed/performed Overall Cognitive Status: History of cognitive impairments - at baseline                                 General Comments: SHe is aware she is at the hospital this date      Exercises Other Exercises Other Exercises: Seated ther-ex performed on B LE including AP, SLRs, SAQ, and hip abd/add. 12 reps with mod assist on B LE and cues for technique. Reports pain with movement.     General Comments        Pertinent Vitals/Pain Pain Assessment: Faces Faces Pain Scale: Hurts whole lot Pain Location: L hip with movement Pain Descriptors / Indicators: Operative site guarding;Discomfort;Dull Pain Intervention(s): Limited activity within patient's tolerance;Repositioned    Home Living  Prior Function            PT Goals (current goals can now be found in the care plan section) Acute Rehab PT Goals Patient Stated Goal: unable to state PT Goal Formulation: Patient unable to participate in goal setting Time For Goal Achievement: 12/12/19 Potential to Achieve Goals: Fair Progress towards PT goals: Progressing toward goals    Frequency    BID      PT Plan Current plan remains appropriate    Co-evaluation              AM-PAC PT "6 Clicks" Mobility   Outcome Measure  Help needed turning from your back to your side while in a flat bed without using bedrails?:  Total Help needed moving from lying on your back to sitting on the side of a flat bed without using bedrails?: Total Help needed moving to and from a bed to a chair (including a wheelchair)?: Total Help needed standing up from a chair using your arms (e.g., wheelchair or bedside chair)?: Total Help needed to walk in hospital room?: Total Help needed climbing 3-5 steps with a railing? : Total 6 Click Score: 6    End of Session Equipment Utilized During Treatment: Gait belt;Oxygen Activity Tolerance: Patient tolerated treatment well Patient left: in chair;with chair alarm set;with SCD's reapplied Nurse Communication: Mobility status PT Visit Diagnosis: Unsteadiness on feet (R26.81);Repeated falls (R29.6);Muscle weakness (generalized) (M62.81);History of falling (Z91.81);Difficulty in walking, not elsewhere classified (R26.2);Pain Pain - Right/Left: Left Pain - part of body: Leg     Time: 6754-4920 PT Time Calculation (min) (ACUTE ONLY): 23 min  Charges:  $Therapeutic Exercise: 8-22 mins $Therapeutic Activity: 8-22 mins                     Elizabeth Palau, PT, DPT [phone]    Angie Wilson 11/29/2019, 12:15 PM

## 2019-11-29 NOTE — Discharge Summary (Signed)
Angie Wilson UMP:[id number] DOB: 1926/02/15 DOA: 11/26/2019  PCP: Danella Penton, MD  Admit date: 11/26/2019 Discharge date: 11/29/2019  Admitted From: Home Disposition: [place]  Recommendations for Outpatient Follow-up:  1. Follow up with PCP in 1 week 2. Please obtain BMP/CBC in one week 3. Please follow up on the following pending results: None 4. Orthopedics: [facility] in 2 weeks  Home Health: No   Discharge Condition:Stable CODE STATUS: Full Diet recommendation: Heart Healthy   Brief/Interim Summary: Mrs.  Wilson a 85 y.o.femalewith medical history significant ofdementia,CVA,nonischemic cardiomyopathy, PVD, intracerebral aneurysm, hypertension, and COPD who presents for unwitnessed fall. CT head and CT cervical spine showed no acute intracranial pathology. No fractures of the cervical spine. Chest x-ray showed no acute cardiopulmonary disease and a nondisplaced fracture of the distal left clavicle, placed on sling immobilizer since this nonoperable. Hip x-ray showed mildly displaced, noncomminuted left femoral neck fracture.  Orthopedics was consulted, patient underwent status post left hip hemiarthroplasty on 11/27/2019.  PT OT was consulted recommending SNF.   Discharge Diagnoses:  Principal Problem:   Hip fracture (HCC) Active Problems:   Clavicular fracture   Nonischemic cardiomyopathy (HCC)   AKI (acute kidney injury) (HCC)   Dementia without behavioral disturbance (HCC)   COPD with chronic bronchitis (HCC)   Closed fracture of neck of left femur [facility])    Discharge Instructions  Discharge Instructions    Call MD for:  temperature >100.4   Complete by: As directed    Diet - low sodium heart healthy   Complete by: As directed    Discharge instructions   Complete by: As directed    Orthopedics: [facility] in 2 weeks   Increase activity slowly   Complete by: As directed      Allergies as of 11/29/2019      Reactions   Other     Other reaction(s): Other (See Comments), Unknown Dust and Mold Dust and Mold   Penicillins    Other reaction(s): Other (See Comments)   Prednisone    Other reaction(s): Other (See Comments), Other (See Comments) Not allergic, but does not like the way she feels at increased dosages. Lower dose works well for pt. Not allergic but does not like the way she feels but it works well   Sulfa Antibiotics    Other reaction(s): Unknown      Medication List    STOP taking these medications   polyethylene glycol powder 17 GM/SCOOP powder Commonly known as: GLYCOLAX/MIRALAX     TAKE these medications   acetaminophen 325 MG tablet Commonly known as: TYLENOL Take 325 mg by mouth daily.   aspirin 81 MG EC tablet Take 81 mg by mouth every other day.   Cranberry 500 MG Caps Take 500 mg by mouth daily.   divalproex 125 MG DR tablet Commonly known as: DEPAKOTE Take 1-2 tablets by mouth See admin instructions. Take 1 tablet (125mg ) by mouth daily at 1PM and take  tablet (62.5mg ) by mouth daily as needed for agitation   enoxaparin 30 MG/0.3ML injection Commonly known as: LOVENOX Inject 0.3 mLs (30 mg total) into the skin daily for 14 days.   galantamine 12 MG tablet Commonly known as: RAZADYNE Take 12 mg by mouth 2 (two) times daily with a meal.   latanoprost 0.005 % ophthalmic solution Commonly known as: XALATAN Place 1 drop into both eyes at bedtime.   lisinopril-hydrochlorothiazide 20-25 MG tablet Commonly known as: ZESTORETIC Take 0.5 tablets by mouth daily.  loratadine 10 MG tablet Commonly known as: CLARITIN Take 10 mg by mouth daily.   montelukast 10 MG tablet Commonly known as: SINGULAIR Take 10 mg by mouth daily.   senna-docusate 8.6-50 MG tablet Commonly known as: Senokot-S Take 1 tablet by mouth at bedtime as needed for mild constipation.   Symbicort 80-4.5 MCG/ACT inhaler Generic drug: budesonide-formoterol Inhale 1 puff into the lungs daily.    therapeutic multivitamin-minerals tablet Take 1 tablet by mouth daily.   traMADol 50 MG tablet Commonly known as: ULTRAM Take 1 tablet (50 mg total) by mouth every 6 (six) hours as needed for moderate pain.   traZODone 50 MG tablet Commonly known as: DESYREL Take 50 mg by mouth at bedtime.       Contact information for follow-up providers    Reche Dixon, PA-C Follow up in 2 week(s).   Specialty: Orthopedic Surgery Why: For x-rays and staple removal Contact information: [address] [place] [address] [phone]            Contact information for after-discharge care    Destination    [facility] .   Service: Skilled Nursing Contact information: [place] [address] [phone]                 Allergies  Allergen Reactions  . Other     Other reaction(s): Other (See Comments), Unknown Dust and Mold Dust and Mold   . Penicillins     Other reaction(s): Other (See Comments)  . Prednisone     Other reaction(s): Other (See Comments), Other (See Comments) Not allergic, but does not like the way she feels at increased dosages. Lower dose works well for pt. Not allergic but does not like the way she feels but it works well   . Sulfa Antibiotics     Other reaction(s): Unknown    Consultations:  Orthopedics   Procedures/Studies: DG Chest 1 View  Result Date: 11/26/2019 CLINICAL DATA:  Pt presents to the ED for a fall. Pt is c/o L shoulder and L hip pain. Pt has dementia at baseline. Denies hitting her head and LOC. EXAM: CHEST  1 VIEW COMPARISON:  Chest CT, 10/12/2015. FINDINGS: Cardiac silhouette is normal size.  No mediastinal hilar masses. Prominent bronchovascular and interstitial lung markings. No evidence of pneumonia or pulmonary edema. No pleural effusion or pneumothorax. Skeletal structures are demineralized. Nondisplaced fracture of the distal left clavicle is  noted. IMPRESSION: 1. No acute cardiopulmonary disease. 2. Nondisplaced fracture of the distal left clavicle Electronically Signed   By: Lajean Manes M.D.   On: 11/26/2019 19:43   CT Head Wo Contrast  Result Date: 11/26/2019 CLINICAL DATA:  Fall EXAM: CT HEAD WITHOUT CONTRAST CT CERVICAL SPINE WITHOUT CONTRAST TECHNIQUE: Multidetector CT imaging of the head and cervical spine was performed following the standard protocol without intravenous contrast. Multiplanar CT image reconstructions of the cervical spine were also generated. COMPARISON:  09/17/2019 FINDINGS: CT HEAD FINDINGS Brain: No evidence of acute infarction, hemorrhage, hydrocephalus, extra-axial collection or mass lesion/mass effect. Extensive periventricular and deep white matter hypodensity. Mild global volume loss. Vascular: No hyperdense vessel or unexpected calcification. Skull: Normal. Negative for fracture or focal lesion. Sinuses/Orbits: No acute finding. Other: None. CT CERVICAL SPINE FINDINGS Alignment: Degenerative straightening of the normal cervical lordosis. Skull base and vertebrae: No acute fracture. No primary bone lesion or focal pathologic process. Soft tissues and spinal canal: No prevertebral fluid or swelling. No  visible canal hematoma. Disc levels: Moderate disc space height loss and osteophytosis of the lower cervical spine. Upper chest: Negative. Other: None. IMPRESSION: 1. No acute intracranial pathology. 2. Advanced small-vessel white matter disease. 3. No fracture or static subluxation of the cervical spine. Electronically Signed   By: Lauralyn PrimesAlex  Bibbey M.D.   On: 11/26/2019 20:06   CT Cervical Spine Wo Contrast  Result Date: 11/26/2019 CLINICAL DATA:  Fall EXAM: CT HEAD WITHOUT CONTRAST CT CERVICAL SPINE WITHOUT CONTRAST TECHNIQUE: Multidetector CT imaging of the head and cervical spine was performed following the standard protocol without intravenous contrast. Multiplanar CT image reconstructions of the cervical spine  were also generated. COMPARISON:  09/17/2019 FINDINGS: CT HEAD FINDINGS Brain: No evidence of acute infarction, hemorrhage, hydrocephalus, extra-axial collection or mass lesion/mass effect. Extensive periventricular and deep white matter hypodensity. Mild global volume loss. Vascular: No hyperdense vessel or unexpected calcification. Skull: Normal. Negative for fracture or focal lesion. Sinuses/Orbits: No acute finding. Other: None. CT CERVICAL SPINE FINDINGS Alignment: Degenerative straightening of the normal cervical lordosis. Skull base and vertebrae: No acute fracture. No primary bone lesion or focal pathologic process. Soft tissues and spinal canal: No prevertebral fluid or swelling. No visible canal hematoma. Disc levels: Moderate disc space height loss and osteophytosis of the lower cervical spine. Upper chest: Negative. Other: None. IMPRESSION: 1. No acute intracranial pathology. 2. Advanced small-vessel white matter disease. 3. No fracture or static subluxation of the cervical spine. Electronically Signed   By: Lauralyn PrimesAlex  Bibbey M.D.   On: 11/26/2019 20:06   DG Pelvis Portable  Result Date: 11/27/2019 CLINICAL DATA:  Status post left hip replacement EXAM: PORTABLE PELVIS 1-2 VIEWS COMPARISON:  None. FINDINGS: Left hip prosthesis is noted. Pelvic ring is intact. No gross soft tissue or bony abnormality is seen. IMPRESSION: Status post left hip prosthesis Electronically Signed   By: Alcide CleverMark  Lukens M.D.   On: 11/27/2019 15:08   DG Pelvis Portable  Result Date: 11/27/2019 CLINICAL DATA:  Operative image during left hip arthroplasty. EXAM: PORTABLE PELVIS 1-2 VIEWS COMPARISON:  11/26/2019 FINDINGS: Single image shows placement of the femoral component of a left hip arthroplasty, which appears well seated. IMPRESSION: Portable operative image during left hip arthroplasty. Electronically Signed   By: Amie Portlandavid  Ormond M.D.   On: 11/27/2019 13:47   DG Shoulder Left  Result Date: 11/26/2019 CLINICAL DATA:  Fall.   Left shoulder pain. EXAM: LEFT SHOULDER - 2+ VIEW COMPARISON:  11/13/2007 FINDINGS: Nondisplaced, non comminuted fracture of the distal clavicle. No other fractures. The glenohumeral and AC joints are normally spaced and aligned. No bone lesions.  Skeletal structures are demineralized. Soft tissues are unremarkable. IMPRESSION: 1. Nondisplaced, nonangulated fracture of the distal left clavicle. 2. No dislocation.  No other fractures. Electronically Signed   By: Amie Portlandavid  Ormond M.D.   On: 11/26/2019 19:41   DG Hip Unilat W or Wo Pelvis 2-3 Views Left  Result Date: 11/26/2019 CLINICAL DATA:  Fall complaining of shoulder and left hip pain. EXAM: DG HIP (WITH OR WITHOUT PELVIS) 2-3V LEFT COMPARISON:  None. FINDINGS: Non comminuted fracture of the left mid femoral neck. The distal fracture component has displaced superiorly by approximately 2.5 cm. No other fractures. Hip joints, SI joints and pubic symphysis are normally aligned. Skeletal structures are diffusely demineralized. IMPRESSION: 1. Mildly displaced, non comminuted left femoral neck fracture. Electronically Signed   By: Amie Portlandavid  Ormond M.D.   On: 11/26/2019 19:40       Subjective: Patient seen and examined today.  PT at bedside.  She has no complaints.  Denies shortness of breath or chest pain.  Discharge Exam: Vitals:   11/29/19 0200 11/29/19 0902  BP:  (!) 143/59  Pulse: 98 94  Resp:  17  Temp:  98.5 F (36.9 C)  SpO2: 98% 98%   Vitals:   11/28/19 1958 11/29/19 0055 11/29/19 0200 11/29/19 0902  BP: 112/72 (!) 147/71  (!) 143/59  Pulse: 97 (!) 105 98 94  Resp: 16 14  17   Temp: 98.2 F (36.8 C) 99.1 F (37.3 C)  98.5 F (36.9 C)  TempSrc: Oral     SpO2: 98% 99% 98% 98%  Weight:      Height:        General: Pt is alert, awake, not in acute distress Cardiovascular: RRR, S1/S2 +, no rubs, no gallops Respiratory: CTA bilaterally, no wheezing, no rhonchi Abdominal: Soft, NT, ND, bowel sounds + Extremities: no edema, no  cyanosis Neuro: No focal deficit    The results of significant diagnostics from this hospitalization (including imaging, microbiology, ancillary and laboratory) are listed below for reference.     Microbiology: Recent Results (from the past 240 hour(s))  Respiratory Panel by RT PCR (Flu A&B, Covid) - Nasopharyngeal Swab     Status: None   Collection Time: 11/26/19 10:00 PM   Specimen: Nasopharyngeal Swab  Result Value Ref Range Status   SARS Coronavirus 2 by RT PCR NEGATIVE NEGATIVE Final    Comment: (NOTE) SARS-CoV-2 target nucleic acids are NOT DETECTED. The SARS-CoV-2 RNA is generally detectable in upper respiratoy specimens during the acute phase of infection. The lowest concentration of SARS-CoV-2 viral copies this assay can detect is 131 copies/mL. A negative result does not preclude SARS-Cov-2 infection and should not be used as the sole basis for treatment or other patient management decisions. A negative result may occur with  improper specimen collection/handling, submission of specimen other than nasopharyngeal swab, presence of viral mutation(s) within the areas targeted by this assay, and inadequate number of viral copies (<131 copies/mL). A negative result must be combined with clinical observations, patient history, and epidemiological information. The expected result is Negative. Fact Sheet for Patients:  https://www.moore.com/https://www.fda.gov/media/142436/download Fact Sheet for Healthcare Providers:  https://www.young.biz/https://www.fda.gov/media/142435/download This test is not yet ap proved or cleared by the [place] FDA and  has been authorized for detection and/or diagnosis of SARS-CoV-2 by FDA under an Emergency Use Authorization (EUA). This EUA will remain  in effect (meaning this test can be used) for the duration of the COVID-19 declaration under Section 564(b)(1) of the Act, 21 U.S.C. section 360bbb-3(b)(1), unless the authorization is terminated or revoked sooner.    Influenza A by  PCR NEGATIVE NEGATIVE Final   Influenza B by PCR NEGATIVE NEGATIVE Final    Comment: (NOTE) The Xpert Xpress SARS-CoV-2/FLU/RSV assay is intended as an aid in  the diagnosis of influenza from Nasopharyngeal swab specimens and  should not be used as a sole basis for treatment. Nasal washings and  aspirates are unacceptable for Xpert Xpress SARS-CoV-2/FLU/RSV  testing. Fact Sheet for Patients: https://www.moore.com/https://www.fda.gov/media/142436/download Fact Sheet for Healthcare Providers: https://www.young.biz/https://www.fda.gov/media/142435/download This test is not yet approved or cleared by the [place] FDA and  has been authorized for detection and/or diagnosis of SARS-CoV-2 by  FDA under an Emergency Use Authorization (EUA). This EUA will remain  in effect (meaning this test can be used) for the duration of the  Covid-19 declaration under Section 564(b)(1) of the Act, 21  U.S.C. section 360bbb-3(b)(1), unless  the authorization is  terminated or revoked. Performed at [facility], [address] [place], [place] [address]   Surgical PCR screen     Status: None   Collection Time: 11/27/19  9:21 AM   Specimen: Nasal Mucosa; Nasal Swab  Result Value Ref Range Status   MRSA, PCR NEGATIVE NEGATIVE Final   Staphylococcus aureus NEGATIVE NEGATIVE Final    Comment: (NOTE) The Xpert SA Assay (FDA approved for NASAL specimens in patients 14 years of age and older), is one component of a comprehensive surveillance program. It is not intended to diagnose infection nor to guide or monitor treatment. Performed at [facility], [address] [place], [place] [address]      Labs: BNP (last 3 results) No results for input(s): BNP in the last 8760 hours. Basic Metabolic Panel: Recent Labs  Lab 11/26/19 1849 11/28/19 0324 11/29/19 0656  NA 141 141 138  K 4.8 4.4 3.8  CL 99 103 103  CO2 GLUCOSE 102* 131* 136*  BUN 29* 21 20  CREATININE 1.09* 1.06* 0.91  CALCIUM 9.7 8.3* 8.2*    Liver Function Tests: No results for input(s): AST, ALT, ALKPHOS, BILITOT, PROT, ALBUMIN in the last 168 hours. No results for input(s): LIPASE, AMYLASE in the last 168 hours. No results for input(s): AMMONIA in the last 168 hours. CBC: Recent Labs  Lab 11/26/19 1849 11/28/19 0324 11/29/19 0656  WBC 9.3 10.8* 13.3*  NEUTROABS 5.3  --   --   HGB 13.8 11.2* 10.4*  HCT 41.0 33.2* 31.0*  MCV 93.2 93.5 93.4  PLT 257 190 151   Cardiac Enzymes: No results for input(s): CKTOTAL, CKMB, CKMBINDEX, TROPONINI in the last 168 hours. BNP: Invalid input(s): POCBNP CBG: No results for input(s): GLUCAP in the last 168 hours. D-Dimer No results for input(s): DDIMER in the last 72 hours. Hgb A1c No results for input(s): HGBA1C in the last 72 hours. Lipid Profile No results for input(s): CHOL, HDL, LDLCALC, TRIG, CHOLHDL, LDLDIRECT in the last 72 hours. Thyroid function studies No results for input(s): TSH, T4TOTAL, T3FREE, THYROIDAB in the last 72 hours.  Invalid input(s): FREET3 Anemia work up No results for input(s): VITAMINB12, FOLATE, FERRITIN, TIBC, IRON, RETICCTPCT in the last 72 hours. Urinalysis    Component Value Date/Time   COLORURINE Yellow 03/31/2012 1734   APPEARANCEUR Hazy 03/31/2012 1734   LABSPEC 1.010 03/31/2012 1734   PHURINE 5.0 03/31/2012 1734   GLUCOSEU Negative 03/31/2012 1734   HGBUR Negative 03/31/2012 1734   BILIRUBINUR Negative 03/31/2012 1734   KETONESUR Negative 03/31/2012 1734   PROTEINUR Negative 03/31/2012 1734   NITRITE Negative 03/31/2012 1734   LEUKOCYTESUR Negative 03/31/2012 1734   Sepsis Labs Invalid input(s): PROCALCITONIN,  WBC,  LACTICIDVEN Microbiology Recent Results (from the past 240 hour(s))  Respiratory Panel by RT PCR (Flu A&B, Covid) - Nasopharyngeal Swab     Status: None   Collection Time: 11/26/19 10:00 PM   Specimen: Nasopharyngeal Swab  Result Value Ref Range Status   SARS Coronavirus 2 by RT PCR NEGATIVE NEGATIVE Final     Comment: (NOTE) SARS-CoV-2 target nucleic acids are NOT DETECTED. The SARS-CoV-2 RNA is generally detectable in upper respiratoy specimens during the acute phase of infection. The lowest concentration of SARS-CoV-2 viral copies this assay can detect is 131 copies/mL. A negative result does not preclude SARS-Cov-2 infection and should not be used as the sole basis for treatment or other patient management decisions. A negative result may  occur with  improper specimen collection/handling, submission of specimen other than nasopharyngeal swab, presence of viral mutation(s) within the areas targeted by this assay, and inadequate number of viral copies (<131 copies/mL). A negative result must be combined with clinical observations, patient history, and epidemiological information. The expected result is Negative. Fact Sheet for Patients:  https://www.moore.com/ Fact Sheet for Healthcare Providers:  https://www.young.biz/ This test is not yet ap proved or cleared by the [place] FDA and  has been authorized for detection and/or diagnosis of SARS-CoV-2 by FDA under an Emergency Use Authorization (EUA). This EUA will remain  in effect (meaning this test can be used) for the duration of the COVID-19 declaration under Section 564(b)(1) of the Act, 21 U.S.C. section 360bbb-3(b)(1), unless the authorization is terminated or revoked sooner.    Influenza A by PCR NEGATIVE NEGATIVE Final   Influenza B by PCR NEGATIVE NEGATIVE Final    Comment: (NOTE) The Xpert Xpress SARS-CoV-2/FLU/RSV assay is intended as an aid in  the diagnosis of influenza from Nasopharyngeal swab specimens and  should not be used as a sole basis for treatment. Nasal washings and  aspirates are unacceptable for Xpert Xpress SARS-CoV-2/FLU/RSV  testing. Fact Sheet for Patients: https://www.moore.com/ Fact Sheet for Healthcare  Providers: https://www.young.biz/ This test is not yet approved or cleared by the [place] FDA and  has been authorized for detection and/or diagnosis of SARS-CoV-2 by  FDA under an Emergency Use Authorization (EUA). This EUA will remain  in effect (meaning this test can be used) for the duration of the  Covid-19 declaration under Section 564(b)(1) of the Act, 21  U.S.C. section 360bbb-3(b)(1), unless the authorization is  terminated or revoked. Performed at [facility], [address], [place], [place] [address]   Surgical PCR screen     Status: None   Collection Time: 11/27/19  9:21 AM   Specimen: Nasal Mucosa; Nasal Swab  Result Value Ref Range Status   MRSA, PCR NEGATIVE NEGATIVE Final   Staphylococcus aureus NEGATIVE NEGATIVE Final    Comment: (NOTE) The Xpert SA Assay (FDA approved for NASAL specimens in patients 64 years of age and older), is one component of a comprehensive surveillance program. It is not intended to diagnose infection nor to guide or monitor treatment. Performed at [facility], [address] [place], [place] [address]    Left nondisplaced fracture of the distal clavicle - non-operable  - sling immobilizer   Displaced noncomminuted left femoral neck fracture S/pLefthip hemiarthroplasty-11/27/19 Pain control PT/ OT recommend SNF Will need to follow-up with orthopedics in 2 weeks  AKI-minimal -creatinine of 1.09 - give small 250cc bolus Stable  Alzheimer's dementia - continue galantamine Trazadone prn for sleep  Nonischemic cardiomyopathy - continue aspirin euvolemic on exam, no exacerbation   Hypertension Resume lisinopril-HCTZ   COPD -continue bronchodilator  Time coordinating discharge: Over 30 minutes  SIGNED:   Lynn Ito, MD  [facility] 11/29/2019, 1:28 PM Pager   If 7PM-7AM, please contact night-coverage [email or web address] Password [email or web address]

## 2019-11-29 NOTE — Progress Notes (Signed)
  Subjective: 2 Days Post-Op Procedure(s) (LRB): ARTHROPLASTY BIPOLAR HIP (HEMIARTHROPLASTY) (Left) Patient reports pain as mild.   Patient is well, and has had no acute complaints or problems Plan is to go Rehab after hospital stay.  Plan for discharge today Negative for chest pain and shortness of breath Fever: no Gastrointestinal: Negative for nausea and vomiting  Objective: Vital signs in last 24 hours: Temp:  [98.2 F (36.8 C)-99.1 F (37.3 C)] 99.1 F (37.3 C) (12/15 0055) Pulse Rate:  [92-105] 98 (12/15 0200) Resp:  [14-17] 14 (12/15 0055) BP: (112-147)/(57-72) 147/71 (12/15 0055) SpO2:  [97 %-100 %] 98 % (12/15 0200)  Intake/Output from previous day: No intake or output data in the 24 hours ending 11/29/19 0630  Intake/Output this shift: No intake/output data recorded.  Labs: Recent Labs    11/26/19 1849 11/28/19 0324  HGB 13.8 11.2*   Recent Labs    11/26/19 1849 11/28/19 0324  WBC 9.3 10.8*  RBC 4.40 3.55*  HCT 41.0 33.2*  PLT 257 190   Recent Labs    11/26/19 1849 11/28/19 0324  NA 141 141  K 4.8 4.4  CL 99 103  CO2 31 29  BUN 29* 21  CREATININE 1.09* 1.06*  GLUCOSE 102* 131*  CALCIUM 9.7 8.3*   Recent Labs    11/26/19 1849  INR 1.1     EXAM General - Patient is Alert and Confused Extremity - Neurovascular intact Sensation intact distally Dorsiflexion/Plantar flexion intact Compartment soft  Left upper extremity mildly tender along the clavicle to palpation.  Limited range of motion of the shoulder. Dressing/Incision - clean, dry, scant bloody drainage Motor Function - intact, moving foot and toes well on exam.   Past Medical History:  Diagnosis Date  . Alzheimer's dementia ([place])   . Glaucoma     Assessment/Plan: 2 Days Post-Op Procedure(s) (LRB): ARTHROPLASTY BIPOLAR HIP (HEMIARTHROPLASTY) (Left) Principal Problem:   Hip fracture (HCC) Active Problems:   Clavicular fracture   Nonischemic cardiomyopathy (HCC)   AKI  (acute kidney injury) ([place])   Dementia without behavioral disturbance (HCC)   COPD with chronic bronchitis (HCC)   Closed fracture of neck of left femur (HCC)  Estimated body mass index is 19.76 kg/m as calculated from the following:   Height as of this encounter: 5\' 4"  (1.626 m).   Weight as of this encounter: 52.2 kg. Advance diet Up with therapy D/C IV fluids Discharge to SNF when cleared by medicine  Follow-up at [facility] clinic orthopedics in 2 weeks for staple removal and x-rays  DVT Prophylaxis - Lovenox, Foot Pumps and TED hose Weight-Bearing as tolerated to left leg  Reche Dixon, PA-C Orthopaedic Surgery 11/29/2019, 6:30 AM

## 2019-11-29 NOTE — Care Management Important Message (Signed)
Important Message  Patient Details  Name: Angie Wilson MRN: [id number] Date of Birth: May 29, 1926   Medicare Important Message Given:  Yes  Initial Medicare IM given by Patient Access Associate on 11/28/2019 at 12:41am.  Still valid.   Dannette Barbara 11/29/2019, 8:24 AM

## 2019-12-01 ENCOUNTER — Encounter: Payer: Self-pay | Admitting: *Deleted

## 2019-12-01 DIAGNOSIS — G301 Alzheimer's disease with late onset: Secondary | ICD-10-CM | POA: Diagnosis not present

## 2019-12-01 DIAGNOSIS — S42002D Fracture of unspecified part of left clavicle, subsequent encounter for fracture with routine healing: Secondary | ICD-10-CM

## 2019-12-01 DIAGNOSIS — S7292XA Unspecified fracture of left femur, initial encounter for closed fracture: Secondary | ICD-10-CM

## 2019-12-01 DIAGNOSIS — I10 Essential (primary) hypertension: Secondary | ICD-10-CM

## 2019-12-01 DIAGNOSIS — I5022 Chronic systolic (congestive) heart failure: Secondary | ICD-10-CM | POA: Diagnosis not present

## 2019-12-01 DIAGNOSIS — F39 Unspecified mood [affective] disorder: Secondary | ICD-10-CM | POA: Diagnosis not present

## 2019-12-01 DIAGNOSIS — J439 Emphysema, unspecified: Secondary | ICD-10-CM | POA: Diagnosis not present

## 2019-12-15 ENCOUNTER — Emergency Department: Payer: Medicare Other

## 2019-12-15 ENCOUNTER — Emergency Department
Admission: EM | Admit: 2019-12-15 | Discharge: 2019-12-15 | Disposition: A | Discharge status: Home / Self Care | Payer: Medicare Other | Attending: Emergency Medicine | Admitting: Emergency Medicine

## 2019-12-15 ENCOUNTER — Encounter: Payer: Self-pay | Admitting: Emergency Medicine

## 2019-12-15 ENCOUNTER — Other Ambulatory Visit: Payer: Self-pay

## 2019-12-15 DIAGNOSIS — S60312A Abrasion of left thumb, initial encounter: Secondary | ICD-10-CM | POA: Insufficient documentation

## 2019-12-15 DIAGNOSIS — Y999 Unspecified external cause status: Secondary | ICD-10-CM | POA: Insufficient documentation

## 2019-12-15 DIAGNOSIS — J449 Chronic obstructive pulmonary disease, unspecified: Secondary | ICD-10-CM | POA: Diagnosis not present

## 2019-12-15 DIAGNOSIS — S0990XA Unspecified injury of head, initial encounter: Secondary | ICD-10-CM

## 2019-12-15 DIAGNOSIS — Z79899 Other long term (current) drug therapy: Secondary | ICD-10-CM | POA: Insufficient documentation

## 2019-12-15 DIAGNOSIS — Y92129 Unspecified place in nursing home as the place of occurrence of the external cause: Secondary | ICD-10-CM | POA: Diagnosis not present

## 2019-12-15 DIAGNOSIS — Y939 Activity, unspecified: Secondary | ICD-10-CM | POA: Diagnosis not present

## 2019-12-15 DIAGNOSIS — W19XXXA Unspecified fall, initial encounter: Secondary | ICD-10-CM | POA: Insufficient documentation

## 2019-12-15 DIAGNOSIS — G309 Alzheimer's disease, unspecified: Secondary | ICD-10-CM | POA: Diagnosis not present

## 2019-12-15 DIAGNOSIS — F0280 Dementia in other diseases classified elsewhere without behavioral disturbance: Secondary | ICD-10-CM | POA: Diagnosis not present

## 2019-12-15 DIAGNOSIS — M25512 Pain in left shoulder: Secondary | ICD-10-CM | POA: Diagnosis not present

## 2019-12-15 DIAGNOSIS — S0003XA Contusion of scalp, initial encounter: Secondary | ICD-10-CM | POA: Diagnosis not present

## 2019-12-15 DIAGNOSIS — Z87891 Personal history of nicotine dependence: Secondary | ICD-10-CM | POA: Insufficient documentation

## 2019-12-15 DIAGNOSIS — S0081XA Abrasion of other part of head, initial encounter: Secondary | ICD-10-CM | POA: Diagnosis not present

## 2019-12-15 DIAGNOSIS — T148XXA Other injury of unspecified body region, initial encounter: Secondary | ICD-10-CM

## 2019-12-15 LAB — URINALYSIS, COMPLETE (UACMP) WITH MICROSCOPIC
Bilirubin Urine: NEGATIVE
Glucose, UA: NEGATIVE mg/dL
Ketones, ur: 5 mg/dL — AB
Leukocytes,Ua: NEGATIVE
Nitrite: NEGATIVE
Protein, ur: NEGATIVE mg/dL
Specific Gravity, Urine: 1.017 (ref 1.005–1.030)
pH: 6 (ref 5.0–8.0)

## 2019-12-15 LAB — BASIC METABOLIC PANEL
Anion gap: 10 (ref 5–15)
BUN: 26 mg/dL — ABNORMAL HIGH (ref 8–23)
CO2: 30 mmol/L (ref 22–32)
Calcium: 9.3 mg/dL (ref 8.9–10.3)
Chloride: 100 mmol/L (ref 98–111)
Creatinine, Ser: 0.9 mg/dL (ref 0.44–1.00)
GFR calc Af Amer: >60 mL/min (ref >60)
GFR calc non Af Amer: 56 mL/min — ABNORMAL LOW (ref >60)
Glucose, Bld: 110 mg/dL — ABNORMAL HIGH (ref 70–99)
Potassium: 4.2 mmol/L (ref 3.5–5.1)
Sodium: 140 mmol/L (ref 135–145)

## 2019-12-15 LAB — CBC
HCT: 36.6 % (ref 36.0–46.0)
Hemoglobin: 11.4 g/dL — ABNORMAL LOW (ref 12.0–15.0)
MCH: 30.1 pg (ref 26.0–34.0)
MCHC: 31.1 g/dL (ref 30.0–36.0)
MCV: 96.6 fL (ref 80.0–100.0)
Platelets: 787 10*3/uL — ABNORMAL HIGH (ref 150–400)
RBC: 3.79 MIL/uL — ABNORMAL LOW (ref 3.87–5.11)
RDW: 13.4 % (ref 11.5–15.5)
WBC: 7.5 10*3/uL (ref 4.0–10.5)
nRBC: 0 % (ref 0.0–0.2)

## 2019-12-15 NOTE — ED Triage Notes (Signed)
Patient presents to the ED post fall during the night from [facility] via EMS. Patient has an abrasion to the right side of her head and to her left thumb.  Patient is alert and pleasantly confused, which is baseline per her daughter.  Patient states she, "hurts all over."  Patient does not remember the fall.

## 2019-12-15 NOTE — ED Triage Notes (Signed)
Pt comes into the ED via EMS from [facility] from the rehab center, states she fell last night and hit her head, states today she is lethargic and confused.EMS reports pt is a/ox4 and talking the whole way to the ED.

## 2019-12-15 NOTE — ED Provider Notes (Signed)
[facility] Emergency Department Provider Note ____________________________________________  Time seen: Approximately 7:43 PM  I have reviewed the triage vital signs and the nursing notes.   HISTORY  Chief Complaint Fall    HPI Angie Wilson is a 85 y.o. female who presents to the emergency department for evaluation and treatment after sustaining a fall during the night.  She is a resident at [facility].  She has an abrasion to the right side of her head into her left thumb.  She is alert but confused which is her baseline according to her daughter who is also her power of attorney.  Patient does not recall the fall.  Past Medical History:  Diagnosis Date  . Alzheimer's dementia (HCC)   . Glaucoma     Patient Active Problem List   Diagnosis Date Noted  . Clavicular fracture 11/27/2019  . Nonischemic cardiomyopathy (HCC) 11/27/2019  . AKI (acute kidney injury) (HCC) 11/27/2019  . Dementia without behavioral disturbance (HCC) 11/27/2019  . COPD with chronic bronchitis (HCC) 11/27/2019  . Closed fracture of neck of left femur (HCC)   . Hip fracture (HCC) 11/26/2019    Past Surgical History:  Procedure Laterality Date  . ABDOMINAL HYSTERECTOMY    . HIP ARTHROPLASTY Left 11/27/2019   Procedure: ARTHROPLASTY BIPOLAR HIP (HEMIARTHROPLASTY);  Surgeon: Signa Kell, MD;  Location: [facility] ORS;  Service: Orthopedics;  Laterality: Left;    Prior to Admission medications   Medication Sig Start Date End Date Taking? Authorizing Provider  acetaminophen (TYLENOL) 325 MG tablet Take 325 mg by mouth daily.    [provider]  aspirin 81 MG EC tablet Take 81 mg by mouth every other day.    [provider]  budesonide-formoterol (SYMBICORT) 80-4.5 MCG/ACT inhaler Inhale 1 puff into the lungs daily.    [provider]  Cranberry 500 MG CAPS Take 500 mg by mouth daily.     [provider]  divalproex (DEPAKOTE) 125 MG DR tablet  Take 1-2 tablets by mouth See admin instructions. Take 1 tablet (125mg ) by mouth daily at 1PM and take  tablet (62.5mg ) by mouth daily as needed for agitation 08/25/19   [provider]  enoxaparin (LOVENOX) 30 MG/0.3ML injection Inject 0.3 mLs (30 mg total) into the skin daily for 14 days. 11/29/19 12/13/19  Dedra Skeens, PA-C  galantamine (RAZADYNE) 12 MG tablet Take 12 mg by mouth 2 (two) times daily with a meal. 09/15/19   [provider]  latanoprost (XALATAN) 0.005 % ophthalmic solution Place 1 drop into both eyes at bedtime. 08/25/19   [provider]  lisinopril-hydrochlorothiazide (ZESTORETIC) 20-25 MG tablet Take 0.5 tablets by mouth daily. 08/28/19   [provider]  loratadine (CLARITIN) 10 MG tablet Take 10 mg by mouth daily.    [provider]  montelukast (SINGULAIR) 10 MG tablet Take 10 mg by mouth daily. 08/25/19   [provider]  senna-docusate (SENOKOT-S) 8.6-50 MG tablet Take 1 tablet by mouth at bedtime as needed for mild constipation. 11/29/19   Lynn Ito, MD  therapeutic multivitamin-minerals [facility]) tablet Take 1 tablet by mouth daily.    [provider]  traMADol (ULTRAM) 50 MG tablet Take 1 tablet (50 mg total) by mouth every 6 (six) hours as needed for moderate pain. 11/29/19   Dedra Skeens, PA-C  traZODone (DESYREL) 50 MG tablet Take 50 mg by mouth at bedtime. 08/27/19   [provider]    Allergies Other, Penicillins, Prednisone, and Sulfa antibiotics  No family history on file.  Social History Social History   Tobacco Use  . Smoking status: Former Smoker    Quit date: 11/27/1987    Years since quitting: 32.0  . Smokeless tobacco: Never Used  Substance Use Topics  . Alcohol use: Not Currently  . Drug use: Never    Review of Systems  Level 5 caveat: Patient confused ____________________________________________   PHYSICAL EXAM:  VITAL SIGNS: ED Triage Vitals  Enc Vitals Group      BP 12/15/19 1502 (!) 104/48     Pulse Rate 12/15/19 1502 90     Resp 12/15/19 1502 16     Temp 12/15/19 1502 98.8 F (37.1 C)     Temp Source 12/15/19 1502 Oral     SpO2 12/15/19 1502 98 %     Weight 12/15/19 1510 118 lb (53.5 kg)     Height 12/15/19 1510 5\' 3"  (1.6 m)     Head Circumference --      Peak Flow --      Pain Score --      Pain Loc --      Pain Edu? --      Excl. in GC? --     Constitutional: Alert and oriented. Well appearing and in no acute distress. Eyes: Conjunctivae are clear without discharge or drainage Head: Atraumatic Neck: Supple.  No focal midline tenderness. Respiratory: No cough. Respirations are even and unlabored. Musculoskeletal: Patient observed moving all extremities without obvious pain.  She is able to flex the left knee without grimacing or complaining of pain. Neurologic: Alert, oriented to person, disoriented to place and time. Skin: Surgical site on left hip is clean and dry. No surrounding erythema or discharge from wound.   Psychiatric: Affect and behavior are appropriate.  ____________________________________________   LABS (all labs ordered are listed, but only abnormal results are displayed)  Labs Reviewed  BASIC METABOLIC PANEL - Abnormal; Notable for the following components:      Result Value   Glucose, Bld 110 (*)    BUN 26 (*)    GFR calc non Af Amer 56 (*)    All other components within normal limits  CBC - Abnormal; Notable for the following components:   RBC 3.79 (*)    Hemoglobin 11.4 (*)    Platelets 787 (*)    All other components within normal limits  URINALYSIS, COMPLETE (UACMP) WITH MICROSCOPIC - Abnormal; Notable for the following components:   Color, Urine YELLOW (*)    APPearance CLEAR (*)    Hgb urine dipstick MODERATE (*)    Ketones, ur 5 (*)    Bacteria, UA RARE (*)    All other components within normal limits   ____________________________________________  RADIOLOGY  Left hip and pelvis are  negative for acute bony abnormality.  CT of the head and cervical spine are also negative for acute findings. ____________________________________________   PROCEDURES  Procedures  ____________________________________________   INITIAL IMPRESSION / ASSESSMENT AND PLAN / ED COURSE  Angie Wilson is a 85 y.o. who presents to the emergency department for evaluation after falling last night.  Labs, Images and exam are reassuring.  Patient will be discharged back to [facility].  Her daughter who is her power of attorney was contacted and given the results of labs and imaging.  She feels reassured and is aware that her mom is going to be going back.  Medications - No data to display  Pertinent labs & imaging results that  were available during my care of the patient were reviewed by me and considered in my medical decision making (see chart for details).  _________________________________________   FINAL CLINICAL IMPRESSION(S) / ED DIAGNOSES  Final diagnoses:  Minor head injury, initial encounter  Fall, initial encounter  Abrasion    ED Discharge Orders    None       If controlled substance prescribed during this visit, 12 month history viewed on the [facility] prior to issuing an initial prescription for Schedule II or III opiod.   Victorino Dike, FNP 12/15/19 1610    Carrie Mew, MD 12/16/19 [phone]

## 2020-01-02 DIAGNOSIS — J209 Acute bronchitis, unspecified: Secondary | ICD-10-CM | POA: Diagnosis not present

## 2020-01-05 DIAGNOSIS — S7292XA Unspecified fracture of left femur, initial encounter for closed fracture: Secondary | ICD-10-CM

## 2020-01-05 DIAGNOSIS — J439 Emphysema, unspecified: Secondary | ICD-10-CM | POA: Diagnosis not present

## 2020-01-05 DIAGNOSIS — I5022 Chronic systolic (congestive) heart failure: Secondary | ICD-10-CM | POA: Diagnosis not present

## 2020-01-05 DIAGNOSIS — F39 Unspecified mood [affective] disorder: Secondary | ICD-10-CM | POA: Diagnosis not present

## 2020-01-05 DIAGNOSIS — G301 Alzheimer's disease with late onset: Secondary | ICD-10-CM | POA: Diagnosis not present

## 2020-01-20 DIAGNOSIS — F39 Unspecified mood [affective] disorder: Secondary | ICD-10-CM

## 2020-01-20 DIAGNOSIS — I5020 Unspecified systolic (congestive) heart failure: Secondary | ICD-10-CM

## 2020-01-20 DIAGNOSIS — S7292XS Unspecified fracture of left femur, sequela: Secondary | ICD-10-CM

## 2020-01-20 DIAGNOSIS — G309 Alzheimer's disease, unspecified: Secondary | ICD-10-CM

## 2020-01-20 DIAGNOSIS — J449 Chronic obstructive pulmonary disease, unspecified: Secondary | ICD-10-CM

## 2020-01-20 DIAGNOSIS — I10 Essential (primary) hypertension: Secondary | ICD-10-CM | POA: Diagnosis not present

## 2020-02-22 DIAGNOSIS — I428 Other cardiomyopathies: Secondary | ICD-10-CM | POA: Diagnosis not present

## 2020-02-22 DIAGNOSIS — J449 Chronic obstructive pulmonary disease, unspecified: Secondary | ICD-10-CM | POA: Diagnosis not present

## 2020-02-22 DIAGNOSIS — G309 Alzheimer's disease, unspecified: Secondary | ICD-10-CM

## 2020-02-22 DIAGNOSIS — S72002A Fracture of unspecified part of neck of left femur, initial encounter for closed fracture: Secondary | ICD-10-CM

## 2020-03-12 DIAGNOSIS — J441 Chronic obstructive pulmonary disease with (acute) exacerbation: Secondary | ICD-10-CM

## 2020-03-30 DIAGNOSIS — R0982 Postnasal drip: Secondary | ICD-10-CM

## 2020-03-30 DIAGNOSIS — R05 Cough: Secondary | ICD-10-CM

## 2020-04-06 DIAGNOSIS — R05 Cough: Secondary | ICD-10-CM | POA: Diagnosis not present

## 2020-04-19 DIAGNOSIS — J439 Emphysema, unspecified: Secondary | ICD-10-CM

## 2020-04-19 DIAGNOSIS — G301 Alzheimer's disease with late onset: Secondary | ICD-10-CM

## 2020-04-19 DIAGNOSIS — I5022 Chronic systolic (congestive) heart failure: Secondary | ICD-10-CM

## 2020-04-19 DIAGNOSIS — F39 Unspecified mood [affective] disorder: Secondary | ICD-10-CM

## 2020-04-19 DIAGNOSIS — I10 Essential (primary) hypertension: Secondary | ICD-10-CM

## 2020-05-23 DIAGNOSIS — R0982 Postnasal drip: Secondary | ICD-10-CM | POA: Diagnosis not present

## 2020-05-23 DIAGNOSIS — R05 Cough: Secondary | ICD-10-CM | POA: Diagnosis not present

## 2020-06-19 DIAGNOSIS — B3783 Candidal cheilitis: Secondary | ICD-10-CM | POA: Diagnosis not present

## 2020-08-28 DIAGNOSIS — I5022 Chronic systolic (congestive) heart failure: Secondary | ICD-10-CM

## 2020-08-28 DIAGNOSIS — J439 Emphysema, unspecified: Secondary | ICD-10-CM

## 2020-08-28 DIAGNOSIS — F39 Unspecified mood [affective] disorder: Secondary | ICD-10-CM

## 2020-08-28 DIAGNOSIS — I10 Essential (primary) hypertension: Secondary | ICD-10-CM | POA: Diagnosis not present

## 2020-08-28 DIAGNOSIS — G301 Alzheimer's disease with late onset: Secondary | ICD-10-CM | POA: Diagnosis not present

## 2020-09-28 DIAGNOSIS — B351 Tinea unguium: Secondary | ICD-10-CM

## 2020-10-17 DIAGNOSIS — F39 Unspecified mood [affective] disorder: Secondary | ICD-10-CM

## 2020-10-17 DIAGNOSIS — J449 Chronic obstructive pulmonary disease, unspecified: Secondary | ICD-10-CM

## 2020-10-17 DIAGNOSIS — G309 Alzheimer's disease, unspecified: Secondary | ICD-10-CM | POA: Diagnosis not present

## 2020-10-17 DIAGNOSIS — I5030 Unspecified diastolic (congestive) heart failure: Secondary | ICD-10-CM

## 2020-10-17 DIAGNOSIS — I10 Essential (primary) hypertension: Secondary | ICD-10-CM

## 2020-10-31 IMAGING — CT CT MAXILLOFACIAL W/O CM
3 series · 15 of 47 positions shown, 18 images · non-contrast
Comparison: 03/31/2012

CLINICAL DATA: Head trauma, fall

EXAM:
CT HEAD WITHOUT CONTRAST
CT MAXILLOFACIAL WITHOUT CONTRAST
CT CERVICAL SPINE WITHOUT CONTRAST
TECHNIQUE: Multidetector CT imaging of the head, cervical spine, and
maxillofacial structures were performed using the standard protocol
without intravenous contrast. Multiplanar CT image reconstructions
of the cervical spine and maxillofacial structures were also
generated.

[Series 3: max soft · axial · 0.33mm/px · z∈[-263,-141]mm · 9 of 71 slices shown, 12 images]
[im 5/71  brain]
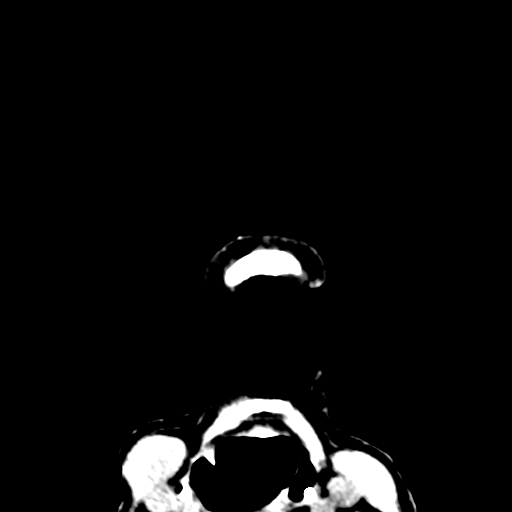
[im 5/71  bone]
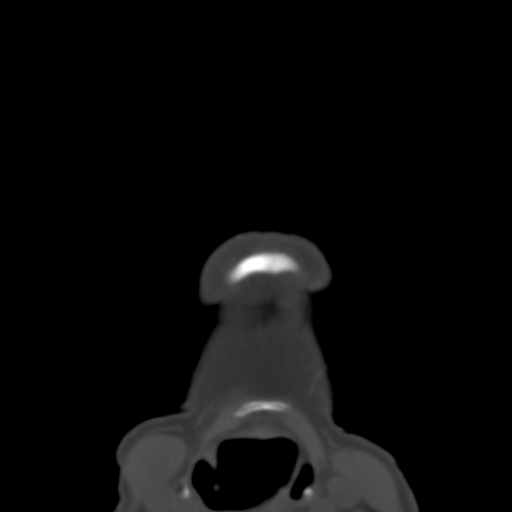
[im 13/71  bone]
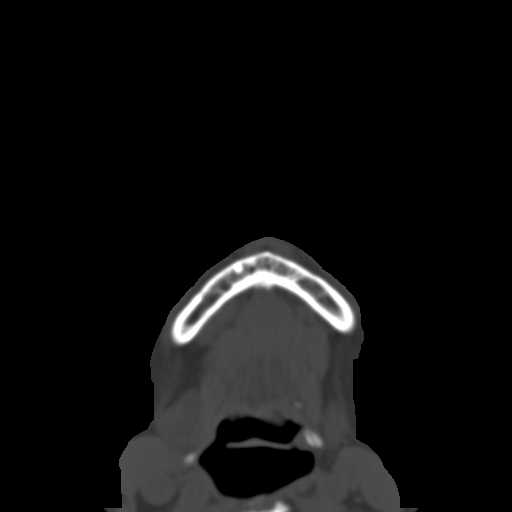
[im 20/71  bone]
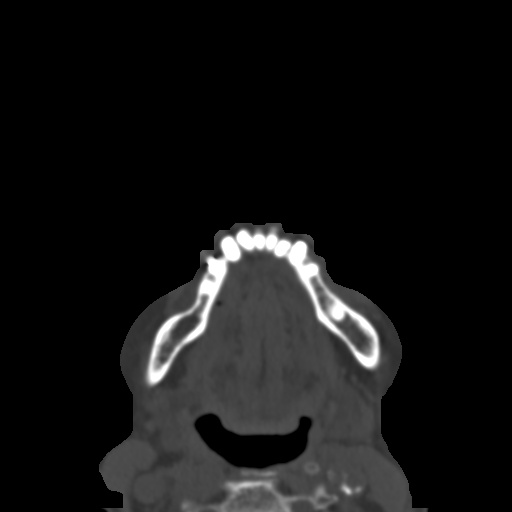
[im 27/71  bone]
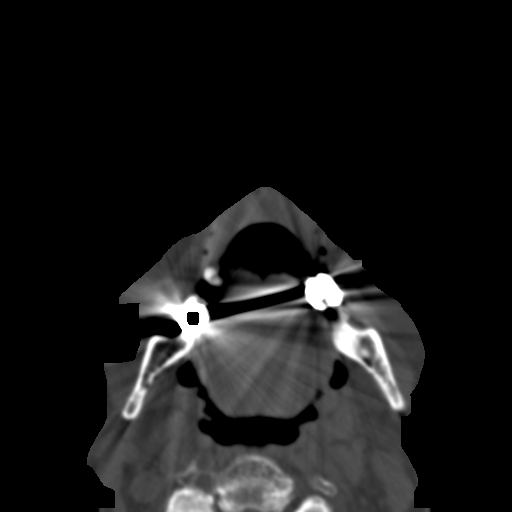
[im 37/71  brain]
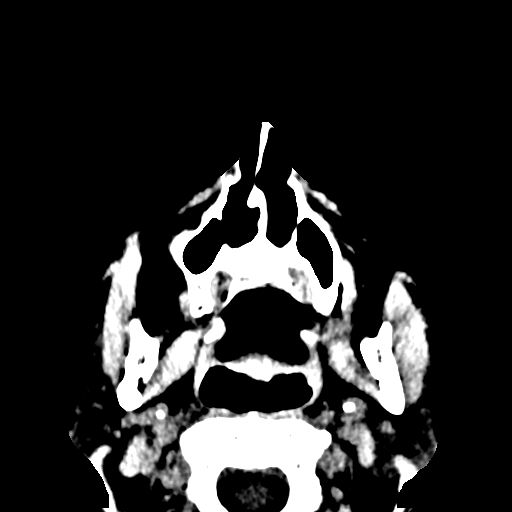
[im 37/71  bone]
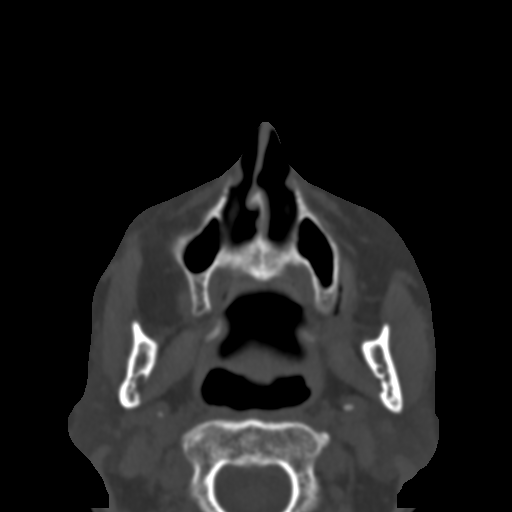
[im 44/71  bone]
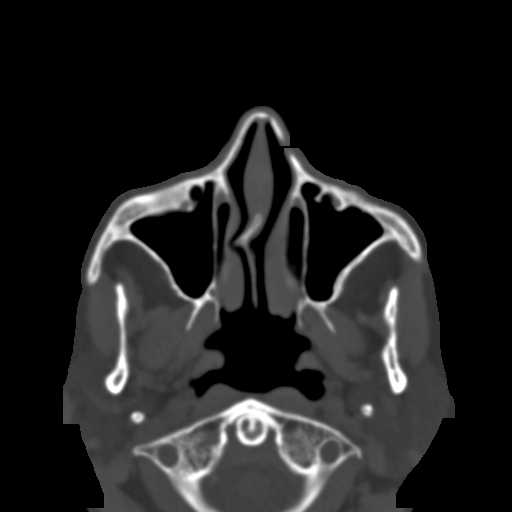
[im 51/71  bone]
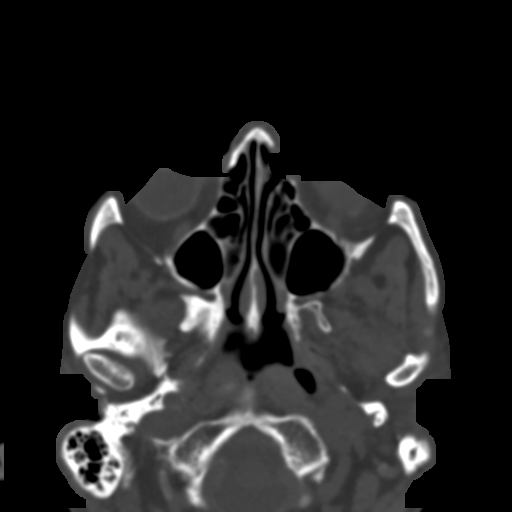
[im 58/71  bone]
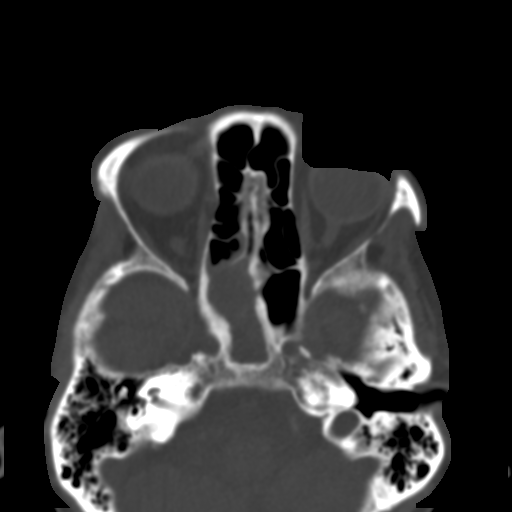
[im 66/71  brain]
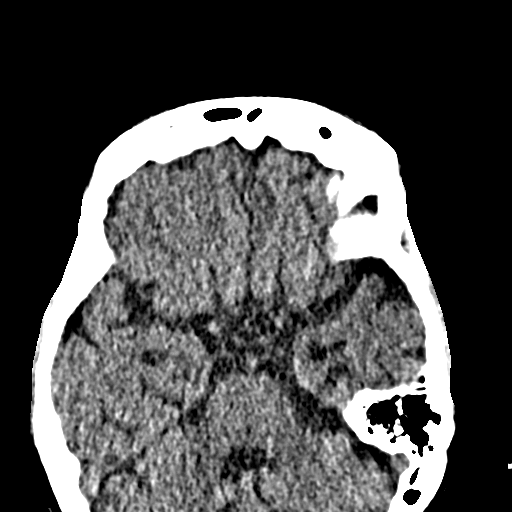
[im 66/71  bone]
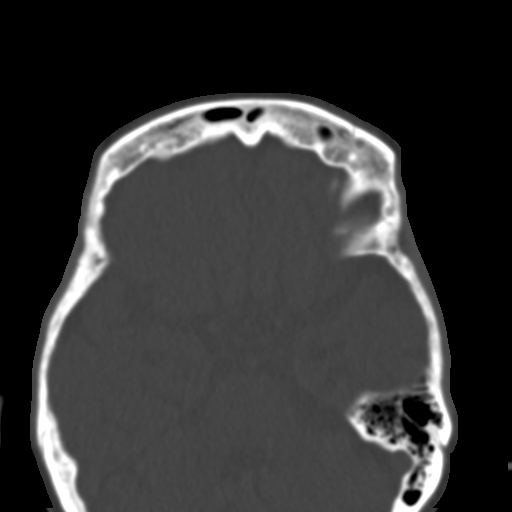

[Series 4: coronal soft · coronal · 0.29mm/px · 3 of 62 slices shown]
[im 21/62  bone]
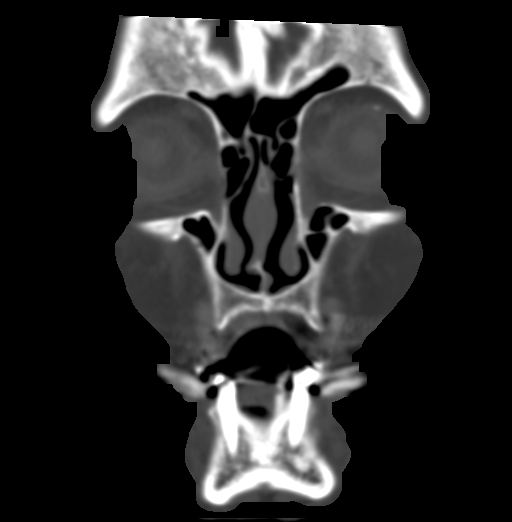
[im 28/62  bone]
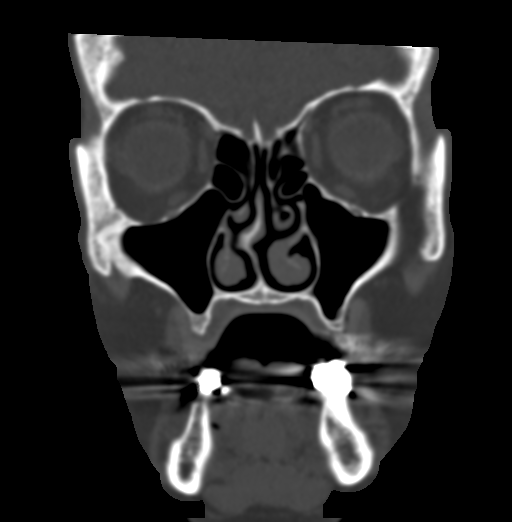
[im 34/62  bone]
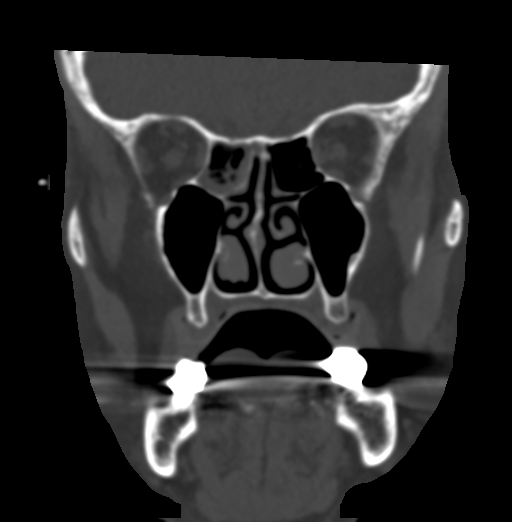

[Series 5: sagittal soft · sagittal · 0.24mm/px · 3 of 75 slices shown]
[im 25/75  bone]
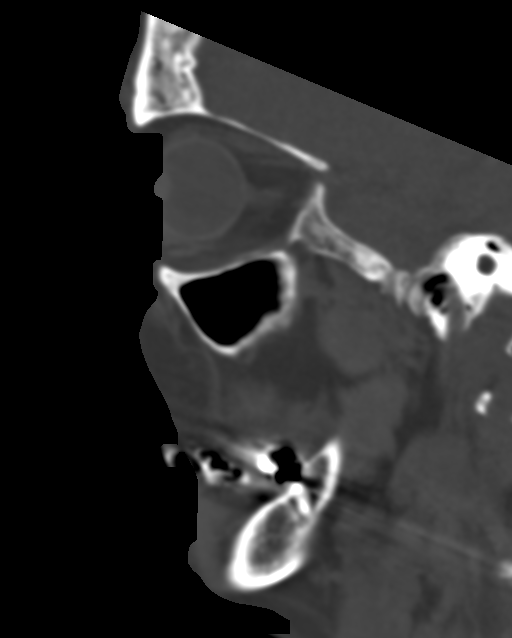
[im 38/75  bone]
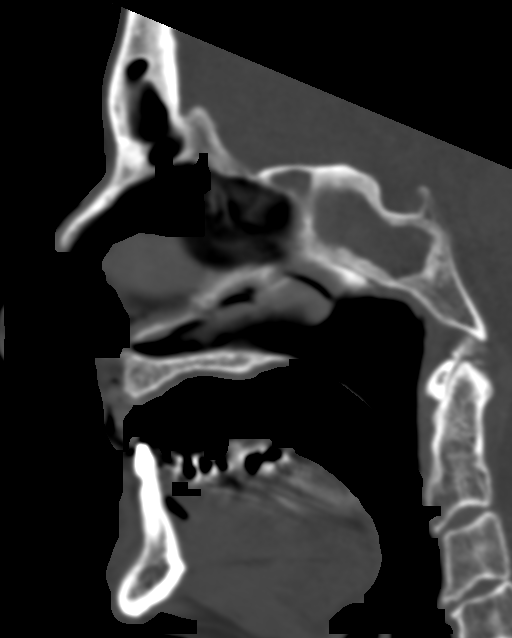
[im 50/75  bone]
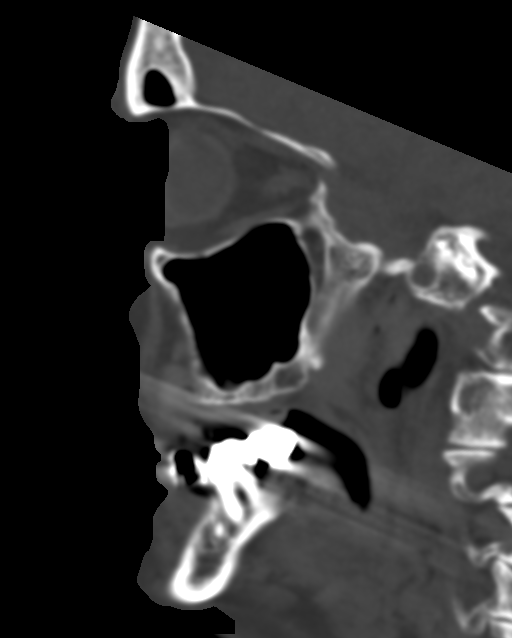

[15 of 47 positions shown; findings below may reference images not displayed]

FINDINGS: CT HEAD FINDINGS

Brain: No evidence of acute infarction, hemorrhage, hydrocephalus,
extra-axial collection or mass lesion/mass effect. Extensive
periventricular and deep white matter hypodensity and volume loss.

Vascular: No hyperdense vessel or unexpected calcification.

CT FACIAL BONES FINDINGS

Skull: Normal. Negative for fracture or focal lesion.

Facial bones: No displaced fractures or dislocations. There is a
circular defect in the left nasal bone and adjacent maxilla,
presumably postoperative (series 6, image 17).

Sinuses/Orbits: No acute finding. There is chronic opacification of
the right sphenoid sinus with bony thickening of the sinus walls.

Other: None.

CT CERVICAL SPINE FINDINGS

Alignment: Degenerative straightening of the normal cervical
lordosis with minimal degenerative anterolisthesis of C4 on C5.

Skull base and vertebrae: No acute fracture. No primary bone lesion
or focal pathologic process.

Soft tissues and spinal canal: No prevertebral fluid or swelling. No
visible canal hematoma.

Disc levels: Moderate multilevel disc space height loss and
osteophytosis.

Upper chest: Negative.

Other: None.
IMPRESSION: 1. No acute intracranial pathology. Advanced small-vessel white
matter disease.

2. There is chronic sinusitis of the right sphenoid sinus with bony
thickening of the sinus walls.

3.  No displaced fracture or dislocation of the facial bones.

4. No fracture or static subluxation of the cervical spine.
Multilevel disc degenerative disease.

## 2020-12-18 DIAGNOSIS — J449 Chronic obstructive pulmonary disease, unspecified: Secondary | ICD-10-CM | POA: Diagnosis not present

## 2020-12-18 DIAGNOSIS — G301 Alzheimer's disease with late onset: Secondary | ICD-10-CM | POA: Diagnosis not present

## 2020-12-18 DIAGNOSIS — I5022 Chronic systolic (congestive) heart failure: Secondary | ICD-10-CM

## 2020-12-18 DIAGNOSIS — F39 Unspecified mood [affective] disorder: Secondary | ICD-10-CM | POA: Diagnosis not present

## 2020-12-18 DIAGNOSIS — I10 Essential (primary) hypertension: Secondary | ICD-10-CM

## 2021-02-20 DIAGNOSIS — M1990 Unspecified osteoarthritis, unspecified site: Secondary | ICD-10-CM

## 2021-02-20 DIAGNOSIS — I5020 Unspecified systolic (congestive) heart failure: Secondary | ICD-10-CM | POA: Diagnosis not present

## 2021-02-20 DIAGNOSIS — I10 Essential (primary) hypertension: Secondary | ICD-10-CM

## 2021-02-20 DIAGNOSIS — F39 Unspecified mood [affective] disorder: Secondary | ICD-10-CM | POA: Diagnosis not present

## 2021-02-20 DIAGNOSIS — J449 Chronic obstructive pulmonary disease, unspecified: Secondary | ICD-10-CM

## 2021-02-20 DIAGNOSIS — G309 Alzheimer's disease, unspecified: Secondary | ICD-10-CM | POA: Diagnosis not present

## 2021-02-28 DIAGNOSIS — R059 Cough, unspecified: Secondary | ICD-10-CM | POA: Diagnosis not present

## 2021-03-06 DIAGNOSIS — S80921A Unspecified superficial injury of right lower leg, initial encounter: Secondary | ICD-10-CM | POA: Diagnosis not present

## 2021-05-01 DIAGNOSIS — R051 Acute cough: Secondary | ICD-10-CM | POA: Diagnosis not present

## 2021-05-28 DIAGNOSIS — L03113 Cellulitis of right upper limb: Secondary | ICD-10-CM

## 2021-06-11 DIAGNOSIS — L97929 Non-pressure chronic ulcer of unspecified part of left lower leg with unspecified severity: Secondary | ICD-10-CM | POA: Diagnosis not present

## 2021-06-11 DIAGNOSIS — L03119 Cellulitis of unspecified part of limb: Secondary | ICD-10-CM | POA: Diagnosis not present

## 2021-06-14 DIAGNOSIS — G309 Alzheimer's disease, unspecified: Secondary | ICD-10-CM

## 2021-06-14 DIAGNOSIS — J449 Chronic obstructive pulmonary disease, unspecified: Secondary | ICD-10-CM

## 2021-06-14 DIAGNOSIS — L03116 Cellulitis of left lower limb: Secondary | ICD-10-CM

## 2021-06-14 DIAGNOSIS — M1990 Unspecified osteoarthritis, unspecified site: Secondary | ICD-10-CM

## 2021-06-14 DIAGNOSIS — I5020 Unspecified systolic (congestive) heart failure: Secondary | ICD-10-CM

## 2021-06-14 DIAGNOSIS — F39 Unspecified mood [affective] disorder: Secondary | ICD-10-CM

## 2021-07-08 DIAGNOSIS — L989 Disorder of the skin and subcutaneous tissue, unspecified: Secondary | ICD-10-CM

## 2021-08-22 DIAGNOSIS — F39 Unspecified mood [affective] disorder: Secondary | ICD-10-CM | POA: Diagnosis not present

## 2021-08-22 DIAGNOSIS — G301 Alzheimer's disease with late onset: Secondary | ICD-10-CM | POA: Diagnosis not present

## 2021-08-22 DIAGNOSIS — I5022 Chronic systolic (congestive) heart failure: Secondary | ICD-10-CM | POA: Diagnosis not present

## 2021-08-22 DIAGNOSIS — J449 Chronic obstructive pulmonary disease, unspecified: Secondary | ICD-10-CM | POA: Diagnosis not present

## 2021-10-02 DIAGNOSIS — R051 Acute cough: Secondary | ICD-10-CM | POA: Diagnosis not present

## 2021-10-16 DIAGNOSIS — G309 Alzheimer's disease, unspecified: Secondary | ICD-10-CM | POA: Diagnosis not present

## 2021-10-16 DIAGNOSIS — I5020 Unspecified systolic (congestive) heart failure: Secondary | ICD-10-CM | POA: Diagnosis not present

## 2021-10-16 DIAGNOSIS — M1990 Unspecified osteoarthritis, unspecified site: Secondary | ICD-10-CM

## 2021-10-16 DIAGNOSIS — I10 Essential (primary) hypertension: Secondary | ICD-10-CM | POA: Diagnosis not present

## 2021-10-16 DIAGNOSIS — J449 Chronic obstructive pulmonary disease, unspecified: Secondary | ICD-10-CM

## 2021-10-16 DIAGNOSIS — F39 Unspecified mood [affective] disorder: Secondary | ICD-10-CM | POA: Diagnosis not present

## 2021-12-23 DIAGNOSIS — J449 Chronic obstructive pulmonary disease, unspecified: Secondary | ICD-10-CM | POA: Diagnosis not present

## 2021-12-23 DIAGNOSIS — G301 Alzheimer's disease with late onset: Secondary | ICD-10-CM | POA: Diagnosis not present

## 2021-12-23 DIAGNOSIS — I10 Essential (primary) hypertension: Secondary | ICD-10-CM

## 2021-12-23 DIAGNOSIS — M9190 Juvenile osteochondrosis of hip and pelvis, unspecified, unspecified leg: Secondary | ICD-10-CM

## 2021-12-23 DIAGNOSIS — F39 Unspecified mood [affective] disorder: Secondary | ICD-10-CM | POA: Diagnosis not present

## 2021-12-23 DIAGNOSIS — I5022 Chronic systolic (congestive) heart failure: Secondary | ICD-10-CM | POA: Diagnosis not present

## 2021-12-23 DIAGNOSIS — M159 Polyosteoarthritis, unspecified: Secondary | ICD-10-CM

## 2022-02-12 DIAGNOSIS — J449 Chronic obstructive pulmonary disease, unspecified: Secondary | ICD-10-CM

## 2022-02-12 DIAGNOSIS — M1990 Unspecified osteoarthritis, unspecified site: Secondary | ICD-10-CM

## 2022-02-12 DIAGNOSIS — I10 Essential (primary) hypertension: Secondary | ICD-10-CM | POA: Diagnosis not present

## 2022-02-12 DIAGNOSIS — F39 Unspecified mood [affective] disorder: Secondary | ICD-10-CM | POA: Diagnosis not present

## 2022-02-12 DIAGNOSIS — I5020 Unspecified systolic (congestive) heart failure: Secondary | ICD-10-CM | POA: Diagnosis not present

## 2022-02-12 DIAGNOSIS — G309 Alzheimer's disease, unspecified: Secondary | ICD-10-CM | POA: Diagnosis not present

## 2022-02-21 DIAGNOSIS — R0602 Shortness of breath: Secondary | ICD-10-CM | POA: Diagnosis not present

## 2022-02-21 DIAGNOSIS — R0981 Nasal congestion: Secondary | ICD-10-CM | POA: Diagnosis not present

## 2022-02-21 DIAGNOSIS — R051 Acute cough: Secondary | ICD-10-CM | POA: Diagnosis not present

## 2022-03-05 DIAGNOSIS — R5383 Other fatigue: Secondary | ICD-10-CM | POA: Diagnosis not present

## 2022-03-05 DIAGNOSIS — R630 Anorexia: Secondary | ICD-10-CM | POA: Diagnosis not present

## 2022-03-05 DIAGNOSIS — R1011 Right upper quadrant pain: Secondary | ICD-10-CM | POA: Diagnosis not present

## 2022-03-15 DEATH — deceased
